# Patient Record
Sex: Female | Born: 1935 | Race: Black or African American | Hispanic: No | Marital: Married | Smoking: Former smoker
Health system: Southern US, Community
[De-identification: ages and names within clinical notes are randomized; demographics above are authoritative.]

## PROBLEM LIST (undated history)

## (undated) DIAGNOSIS — F0390 Unspecified dementia without behavioral disturbance: Secondary | ICD-10-CM

## (undated) DIAGNOSIS — I639 Cerebral infarction, unspecified: Secondary | ICD-10-CM

## (undated) DIAGNOSIS — E7800 Pure hypercholesterolemia, unspecified: Secondary | ICD-10-CM

## (undated) DIAGNOSIS — I10 Essential (primary) hypertension: Secondary | ICD-10-CM

## (undated) DIAGNOSIS — I739 Peripheral vascular disease, unspecified: Secondary | ICD-10-CM

## (undated) HISTORY — PX: ABDOMINAL HYSTERECTOMY: SHX81

## (undated) HISTORY — DX: Essential (primary) hypertension: I10

---

## 2004-08-24 ENCOUNTER — Ambulatory Visit: Payer: Self-pay | Admitting: Internal Medicine

## 2006-01-12 ENCOUNTER — Ambulatory Visit: Payer: Self-pay | Admitting: Internal Medicine

## 2006-09-15 ENCOUNTER — Ambulatory Visit: Payer: Self-pay | Admitting: Ophthalmology

## 2006-09-25 ENCOUNTER — Ambulatory Visit: Payer: Self-pay | Admitting: Ophthalmology

## 2007-01-17 ENCOUNTER — Ambulatory Visit: Payer: Self-pay | Admitting: Internal Medicine

## 2008-01-21 ENCOUNTER — Ambulatory Visit: Payer: Self-pay | Admitting: Internal Medicine

## 2008-12-25 ENCOUNTER — Ambulatory Visit: Payer: Self-pay | Admitting: Gastroenterology

## 2009-03-12 ENCOUNTER — Ambulatory Visit: Payer: Self-pay | Admitting: Internal Medicine

## 2009-09-30 ENCOUNTER — Ambulatory Visit: Payer: Self-pay | Admitting: Internal Medicine

## 2010-05-10 ENCOUNTER — Ambulatory Visit: Payer: Self-pay | Admitting: Internal Medicine

## 2010-12-21 ENCOUNTER — Ambulatory Visit: Payer: Self-pay | Admitting: Family Medicine

## 2011-05-12 ENCOUNTER — Ambulatory Visit: Payer: Self-pay | Admitting: Family Medicine

## 2012-02-08 ENCOUNTER — Ambulatory Visit: Payer: Self-pay | Admitting: Family Medicine

## 2013-01-26 ENCOUNTER — Ambulatory Visit: Payer: Self-pay | Admitting: Emergency Medicine

## 2013-01-26 LAB — CBC WITH DIFFERENTIAL/PLATELET
Basophil #: 0.1 10*3/uL (ref 0.0–0.1)
Basophil %: 0.9 %
Eosinophil #: 0 10*3/uL (ref 0.0–0.7)
Eosinophil %: 0.4 %
HCT: 40.2 % (ref 35.0–47.0)
HGB: 13.2 g/dL (ref 12.0–16.0)
Lymphocyte #: 1.1 10*3/uL (ref 1.0–3.6)
Lymphocyte %: 19.4 %
MCH: 29.8 pg (ref 26.0–34.0)
MCHC: 32.7 g/dL (ref 32.0–36.0)
MCV: 91 fL (ref 80–100)
Monocyte #: 0.3 x10 3/mm (ref 0.2–0.9)
Monocyte %: 5 %
Neutrophil #: 4.3 10*3/uL (ref 1.4–6.5)
Neutrophil %: 74.3 %
Platelet: 234 10*3/uL (ref 150–440)
RBC: 4.42 10*6/uL (ref 3.80–5.20)
RDW: 13.4 % (ref 11.5–14.5)
WBC: 5.7 10*3/uL (ref 3.6–11.0)

## 2013-01-26 LAB — COMPREHENSIVE METABOLIC PANEL
Albumin: 4.6 g/dL (ref 3.4–5.0)
Alkaline Phosphatase: 77 U/L (ref 50–136)
Anion Gap: 8 (ref 7–16)
BUN: 11 mg/dL (ref 7–18)
Bilirubin,Total: 0.5 mg/dL (ref 0.2–1.0)
Calcium, Total: 11.2 mg/dL — ABNORMAL HIGH (ref 8.5–10.1)
Chloride: 100 mmol/L (ref 98–107)
Co2: 33 mmol/L — ABNORMAL HIGH (ref 21–32)
Creatinine: 0.82 mg/dL (ref 0.60–1.30)
EGFR (African American): >60
EGFR (Non-African Amer.): >60
Glucose: 141 mg/dL — ABNORMAL HIGH (ref 65–99)
Osmolality: 283 (ref 275–301)
Potassium: 4.1 mmol/L (ref 3.5–5.1)
SGOT(AST): 24 U/L (ref 15–37)
SGPT (ALT): 22 U/L (ref 12–78)
Sodium: 141 mmol/L (ref 136–145)
Total Protein: 8.7 g/dL — ABNORMAL HIGH (ref 6.4–8.2)

## 2013-03-06 ENCOUNTER — Ambulatory Visit: Payer: Self-pay | Admitting: Family Medicine

## 2013-03-29 ENCOUNTER — Ambulatory Visit: Payer: Self-pay | Admitting: Gastroenterology

## 2014-03-04 ENCOUNTER — Ambulatory Visit: Payer: Self-pay | Admitting: Family Medicine

## 2014-03-13 ENCOUNTER — Ambulatory Visit: Payer: Self-pay | Admitting: Family Medicine

## 2014-04-19 ENCOUNTER — Ambulatory Visit: Payer: Self-pay

## 2014-11-27 ENCOUNTER — Encounter
Admit: 2014-11-27 | Disposition: A | Discharge status: Home / Self Care | Payer: Self-pay | Attending: Gastroenterology | Admitting: Gastroenterology

## 2014-12-03 ENCOUNTER — Ambulatory Visit: Payer: Medicare Other | Admitting: Physical Therapy

## 2014-12-11 ENCOUNTER — Ambulatory Visit: Payer: Medicare Other | Attending: Gastroenterology | Admitting: Physical Therapy

## 2014-12-11 ENCOUNTER — Encounter: Payer: Self-pay | Admitting: Physical Therapy

## 2014-12-11 VITALS — BP 128/58

## 2014-12-11 DIAGNOSIS — K5900 Constipation, unspecified: Secondary | ICD-10-CM | POA: Insufficient documentation

## 2014-12-11 DIAGNOSIS — R278 Other lack of coordination: Secondary | ICD-10-CM | POA: Diagnosis not present

## 2014-12-11 DIAGNOSIS — N3946 Mixed incontinence: Secondary | ICD-10-CM | POA: Diagnosis not present

## 2014-12-11 DIAGNOSIS — M6281 Muscle weakness (generalized): Secondary | ICD-10-CM | POA: Diagnosis not present

## 2014-12-11 DIAGNOSIS — R293 Abnormal posture: Secondary | ICD-10-CM

## 2014-12-11 NOTE — Patient Instructions (Addendum)
   #  1 BELLY MASSAGE  Use natural oil like coconut, olive oil, shea butter. Massage from R pelvis to R rib and then to L ribs and back down on L pelvis. Circle 10x. You can do this after every meal and before pelvic floor exercises.    #2 PELVIC FLOOR / KEGEL EXERCISES   Pelvic floor/ Kegel exercises are used to strengthen the muscles in the base of your pelvis that are responsible for supporting your pelvic organs and preventing urine/feces leakage. Based on your therapist's recommendations, they can be performed while standing, sitting, or lying down.  Make yourself aware of this muscle group by using these cues: . Imagine you are in a crowded room and you feel the need to pass gas. Your response is to pull up and in at the rectum. . Close the rectum. Pull the muscles up inside your body,feeling your scrotum lifting as well . Feel the pelvic floor muscles lift as if you were walking into a cold lake.  Marland Kitchen. Place your hand on top of your pubic bone. Tighten and draw in the muscles around the anal muscles without squeezing the buttock muscles.   Common Errors: . Breath holding: If you are holding your breath, you may be bearing down against your bladder instead of pulling it up. If you belly bulges up while you are squeezing, you are holding your breath. Be sure to breathe gently in and out while exercising. Counting out loud may help you avoid holding your breath. . Accessory muscle use: You should not see or feel other muscle movement when performing pelvic floor exercises. When done properly, no one can tell that you are performing the exercises. Keep the buttocks, belly and inner thighs relaxed. . Overdoing it: Your muscles can fatigue and stop working for you if you over-exercise. You may actually leak more or feel soreness at the lower abdomen or rectum.  YOUR HOME EXERCISE PROGRAM  LONG HOLDS: Position: on back with pillows propped under hips . Inhale and then exhale. Then squeeze the  muscle and count aloud for 10 seconds. Rest with three long breaths. (Be sure to let belly sink in with exhales and not push outward)  . Perform 10 repetitions, 3 times/day                                                                                                   #3 Complete food diary and drink 3 (16 fl oz ) water per day.                #4 Practice sitting tall, feet under knees and sitz bones. No more sitting in a slumped position.

## 2014-12-12 NOTE — Therapy (Signed)
[facility] [address] [place], [place], [address] Phone: [phone]   Fax:  [phone]  Physical Therapy Treatment  Patient Details  Name: Percell Millervelyn Albright Stennett MRN: [id number] Date of Birth: 09/13/1927 Referring Provider:  Elnita Maxwellein, Matthew Gordon, MD  Encounter Date: 12/11/2014      PT End of Session - 12/12/14 2051    Visit Number 2   Number of Visits 12   Date for PT Re-Evaluation 02/05/15   Authorization Type g-code 2/10   PT Start Time 1300   PT Stop Time 1355   PT Time Calculation (min) 55 min   Activity Tolerance Patient tolerated treatment well   Behavior During Therapy [facility] for tasks assessed/performed      Past Medical History  Diagnosis Date  . Hypertension     Past Surgical History  Procedure Laterality Date  . Abdominal hysterectomy      Filed Vitals:   12/11/14 1324  BP: 128/58    Visit Diagnosis:  Abnormal coordination  Abnormal posture  Muscle weakness (generalized)      Subjective Assessment - 12/11/14 1325    Subjective Pt reported she had 2 bowel movements this past week with Type 1. Pt currently drinking 2 -16 fl oz of water per day. Pt used to drink 3 -16 fl oz but not currently.    Pertinent History 1. Bowel leakage (size of silver dollar). Urinary leakage occur when she does not get to the restroom in time. Pt is afraid to go anywhere. 20% of the time, pt notices she will urinate and defecate at the same time. 2) Constipation: dependent on laxatives. Without laxative: pt produces Type 1 Bristol Stool. Dtr was present during interview but not during the assessment. Dtr drove pt to appt but pt still drives. Pt lives with husband. Dtr is close by. Hx of hysterectomy, 7 vaginal deliveries.    Patient Stated Goals 1) to have a complete bowel movement without a laxative 2) go to church and shopping without worrying .                       Pelvic Floor Special Questions -  12/12/14 0001    Prolapse Anterior Wall   Pelvic Floor Internal Exam yes   Exam Type Vaginal   Strength good squeeze, good lift, able to hold agaisnt strong resistance   Strength # of reps 5   Strength # of seconds 10           [facility] Adult PT Treatment/Exercise - 12/12/14 0001    Exercises   Other Exercises  guided pt to perform colon massage    Manual Therapy   Manual therapy comments arvigo abdominal massag, noted improved positioning of pelvic organ postTx and more circumferential contraction compared to posterior > anterior                 PT Education - 12/12/14 2048    Education Details HEP   Person(s) Educated Patient   Methods Explanation;Demonstration;Tactile cues;Handout   Comprehension Verbalized understanding;Returned demonstration;Verbal cues required;Tactile cues required             PT Long Term Goals - 12/12/14 2035    PT LONG TERM GOAL #1   Title Pt will demo increased SLS bilaterally for > 15 sec in order to decrease risk for falls.   Time 12   Period Weeks   Status On-going   PT LONG TERM GOAL #2  Title Pt would decrease the use of pads to 1/day in order to increase participate in community activities.    Time 12   Period Weeks   Status On-going   PT LONG TERM GOAL #3   Title Patient will be independent with completion of home exercise program for pelvic floor exercise, progressing to standing for all ADL's/activities.   Time 12   Period Weeks   Status On-going   PT LONG TERM GOAL #4   Title Patient will score a decrease on COREFO from 34% to < 25% or PFDI -UDI-6 from 37.5% to < 30%  in order to demo improved colorectal or urinary function for attending church.   Time 12   Period Weeks   Status On-going               Plan - 12/12/14 2053    Clinical Impression Statement Pt is progressing well with proper coordination of pelvic floor coordination and showed more circumferential contraction post-manual Tx over abdominal due to  changes in bladder position. Pt is progessing well towards her goals and will benefit from continued skilled PT.    Pt will benefit from skilled therapeutic intervention in order to improve on the following deficits Decreased safety awareness;Decreased endurance;Decreased coordination;Improper body mechanics;Postural dysfunction;Increased fascial restricitons;Increased muscle spasms;Decreased scar mobility;Decreased balance;Decreased strength;Impaired perceived functional ability   Rehab Potential Good   Clinical Impairments Affecting Rehab Potential Hx of 7 vaginal deliveries, abdominal surgeries   PT Frequency 1x / week   PT Duration 12 weeks   PT Treatment/Interventions ADLs/Self Care Home Management;Moist Heat;Therapeutic activities;Patient/family education;Scar mobilization;Therapeutic exercise;Balance training;Manual techniques;Dry needling;Biofeedback;Cryotherapy;Electrical Stimulation;Neuromuscular re-education;Functional mobility training   PT Next Visit Plan progress pelvic floor HEP, added hip strengthening   Consulted and Agree with Plan of Care Patient          G-Codes - 12/12/14 2101    Functional Limitation Self care   Self Care Current Status [phone]([id number]) At least 20 percent but less than 40 percent impaired, limited or restricted   Self Care Goal Status ([id number]) At least 1 percent but less than 20 percent impaired, limited or restricted      Problem List There are no active problems to display for this patient.   Mariane MastersYeung,Shin Yiing ,PT, DPT, E-RYT  12/12/2014, 9:11 PM  [facility] [address] [place], [place], [address] Phone: [phone]   Fax:  [phone]

## 2014-12-18 ENCOUNTER — Ambulatory Visit: Payer: Medicare Other | Admitting: Physical Therapy

## 2014-12-18 ENCOUNTER — Encounter: Payer: Self-pay | Admitting: Physical Therapy

## 2014-12-18 DIAGNOSIS — K5900 Constipation, unspecified: Secondary | ICD-10-CM | POA: Diagnosis not present

## 2014-12-18 DIAGNOSIS — M6281 Muscle weakness (generalized): Secondary | ICD-10-CM

## 2014-12-18 DIAGNOSIS — R278 Other lack of coordination: Secondary | ICD-10-CM

## 2014-12-18 DIAGNOSIS — R293 Abnormal posture: Secondary | ICD-10-CM

## 2014-12-18 NOTE — Patient Instructions (Addendum)
  1) m Shell 45 Degrees  Lying with hips and knees bent 45, one pillow between knees and ankles. Lift knee. Be sure pelvis does not roll backward. Do not arch back. Do 10 times, each leg, 2 times per day.  http://ss.exer.us/75   Copyright  VHI. All rights reserved.   2) You  now ready to begin training the deep core muscles system: diaphragm, transverse abdominis, pelvic floor . These muscles must work together as a team.      The key to these exercises to train the brain to coordinate the timing of these muscles and to have them turn on for long periods of time to hold you upright against gravity (especially important if you are on your feet all day).These muscles are postural muscles and play a role stabilizing your spine and bodyweight. By doing these repetitions slowly and correctly instead of doing crunches, you will achieve a flatter belly without a lower pooch. You are also placing your spine in a more neutral position and breathing properly which in turn, decreases your risk for problems related to your pelvic floor, abdominal, and low back such as pelvic organ prolapse, hernias, diastasis recti (separation of superficial muscles), disk herniations, spinal fractures. These exercises set a solid foundation for you to later progress to resistance/ strength training with therabands and weights and return to other typical fitness exercises with a stronger deeper core.    This week: Practice only Level 1-2. Hold off on 3-4 until our next visit.    3) Long holds to 12 sec , 5 reps --3x day with pillow under hips  4) continue with belly massage and quick holds when seated

## 2014-12-19 NOTE — Therapy (Signed)
[facility] [address] [place], [place], [address] Phone: [phone]   Fax:  [phone]  Physical Therapy Treatment  Patient Details  Name: Erin Greer MRN: [id number] Date of Birth: 08/23/1927 Referring Provider:  Elnita Maxwellein, Matthew Gordon, MD  Encounter Date: 12/18/2014      PT End of Session - 12/18/14 1407    Visit Number 3   Number of Visits 12   Date for PT Re-Evaluation 02/05/15   Authorization Type g-code 3/10   PT Start Time 1300   PT Stop Time 1404   PT Time Calculation (min) 64 min   Activity Tolerance Patient tolerated treatment well   Behavior During Therapy [facility] for tasks assessed/performed      Past Medical History  Diagnosis Date  . Hypertension     Past Surgical History  Procedure Laterality Date  . Abdominal hysterectomy      There were no vitals filed for this visit.  Visit Diagnosis:  Abnormal coordination  Abnormal posture  Muscle weakness (generalized)      Subjective Assessment - 12/18/14 1304    Subjective Pt reported pt had diarrhea four days after last visit day and currently she is had pellet sized bowels without taking any laxative.  Pt has been she trying sit taller. Pt stated she is still having constipation but she no longer experiences loss of control of urine and bowels occuring at the same time.    Pertinent History 1. Bowel leakage (size of silver dollar). Urinary leakage occur when she does not get to the restroom in time. Pt is afraid to go anywhere. 20% of the time, pt notices she will urinate and defecate at the same time. 2) Constipation: dependent on laxatives. Without laxative: pt produces Type 1 Bristol Stool. Dtr was present during interview but not during the assessment. Dtr drove pt to appt but pt still drives. Pt lives with husband. Dtr is close by. Hx of hysterectomy, 7 vaginal deliveries.    Patient Stated Goals 1) to have a complete bowel movement  without a laxative 2) go to church and shopping without worrying .                       Pelvic Floor Special Questions - 12/18/14 1342    Prolapse Anterior Wall  Noted more ventral position without pillow today   Pelvic Floor Internal Exam yes   Exam Type Vaginal   Strength good squeeze, good lift, able to hold agaisnt strong resistance   Strength # of reps 5   Strength # of seconds 12           [facility] Adult PT Treatment/Exercise - 12/18/14 1341    Bed Mobility   Bed Mobility Rolling Left;Left Sidelying to Sit  Required cuing to avoid crunch and practiced 2 reps   Posture/Postural Control   Posture/Postural Control Postural limitations   Postural Limitations --  Lumbopelvic instability w/knee fallout prior to neuro-reed   Posture Comments Dynamic stabilization Level 1-2   minor cuing    Exercises   Other Exercises  --   Knee/Hip Exercises: Sidelying   Clams 10 reps bil    Manual Therapy   Manual therapy comments arvigo abdominal massage  Noted increased abdominal softness and scar mobili                PT Education - 12/19/14 1427    Education provided Yes   Education Details HEP  Person(s) Educated Patient   Methods Explanation;Demonstration;Tactile cues;Handout   Comprehension Verbalized understanding;Returned demonstration;Verbal cues required;Tactile cues required             PT Long Term Goals - 12/18/14 1312    PT LONG TERM GOAL #1   Title Pt will demo increased SLS bilaterally for > 15 sec in order to decrease risk for falls.   Time 12   Period Weeks   Status On-going   PT LONG TERM GOAL #2   Title Pt would decrease the use of pads to 1/day in order to increase participate in community activities.    Time 12   Period Weeks   Status Achieved   PT LONG TERM GOAL #3   Title Patient will be independent with completion of home exercise program for pelvic floor exercise, progressing to standing for all ADL's/activities.   Time 12    Period Weeks   Status Achieved   PT LONG TERM GOAL #4   Title Patient will score a decrease on COREFO from 34% to < 25% or PFDI -UDI-6 from 37.5% to < 30%  in order to demo improved colorectal or urinary function for attending church.   Time 12   Period Weeks   Status On-going   PT LONG TERM GOAL #5   Title Pt will be able to report decreased simulatenous elimination of bowel and urine in order to show increased control of pelvic floor tio attend church.    Time 12   Period Weeks   Status Achieved               Plan - 12/19/14 1428    Clinical Impression Statement Pt showed increased cephalic/ventral positoining of bladder without a need for elevation of her hips to demo a circumferential contraction. Pt was able to progress her pelvic floor endurance exercises, and start dynamic stabilization exercises. Also noted improved scar mobility. To address diet for constipation goal when pt returns her food diary at next visit. Pt is compliant and continues to show progress towards her goals.    Pt will benefit from skilled therapeutic intervention in order to improve on the following deficits Decreased safety awareness;Decreased endurance;Decreased coordination;Improper body mechanics;Postural dysfunction;Increased fascial restricitons;Increased muscle spasms;Decreased scar mobility;Decreased balance;Decreased strength;Impaired perceived functional ability   Rehab Potential Good   Clinical Impairments Affecting Rehab Potential Hx of 7 vaginal deliveries, abdominal surgeries   PT Frequency 1x / week   PT Duration 12 weeks   PT Treatment/Interventions ADLs/Self Care Home Management;Moist Heat;Therapeutic activities;Patient/family education;Scar mobilization;Therapeutic exercise;Balance training;Manual techniques;Dry needling;Biofeedback;Cryotherapy;Electrical Stimulation;Neuromuscular re-education;Functional mobility training   PT Next Visit Plan progress pelvic floor HEP, added hip  strengthening   Consulted and Agree with Plan of Care Patient        Problem List There are no active problems to display for this patient.   Mariane MastersYeung,Shin Yiing ,PT, DPT, E-RYT  12/19/2014, 2:33 PM  [facility] [address] [place], [place], [address] Phone: [phone]   Fax:  [phone]

## 2014-12-25 ENCOUNTER — Ambulatory Visit: Payer: Medicare Other | Admitting: Physical Therapy

## 2015-01-01 ENCOUNTER — Ambulatory Visit: Payer: Medicare Other | Attending: Gastroenterology | Admitting: Physical Therapy

## 2015-01-01 DIAGNOSIS — R278 Other lack of coordination: Secondary | ICD-10-CM | POA: Diagnosis not present

## 2015-01-01 DIAGNOSIS — M6281 Muscle weakness (generalized): Secondary | ICD-10-CM

## 2015-01-01 DIAGNOSIS — R293 Abnormal posture: Secondary | ICD-10-CM | POA: Diagnosis present

## 2015-01-01 NOTE — Patient Instructions (Addendum)
High-Fiber Diet Fiber is found in fruits, vegetables, and grains. A high-fiber diet encourages the addition of more whole grains, legumes, fruits, and vegetables in your diet. The recommended amount of fiber for adult males is 38 g per day. For adult females, it is 25 g per day. Pregnant and lactating women should get 28 g of fiber per day. If you have a digestive or bowel problem, ask your caregiver for advice before adding high-fiber foods to your diet. Eat a variety of high-fiber foods instead of only a select few type of foods.  PURPOSE  To increase stool bulk.  To make bowel movements more regular to prevent constipation.  To lower cholesterol.  To prevent overeating. WHEN IS THIS DIET USED?  It may be used if you have constipation and hemorrhoids.  It may be used if you have uncomplicated diverticulosis (intestine condition) and irritable bowel syndrome.  It may be used if you need help with weight management.  It may be used if you want to add it to your diet as a protective measure against atherosclerosis, diabetes, and cancer. SOURCES OF FIBER  Whole-grain breads and cereals.  Fruits, such as apples, oranges, bananas, berries, prunes, and pears.  Vegetables, such as green peas, carrots, sweet potatoes, beets, broccoli, cabbage, spinach, and artichokes.  Legumes, such split peas, soy, lentils.  Almonds. FIBER CONTENT IN FOODS Starches and Grains / Dietary Fiber (g)  Cheerios, 1 cup / 3 g  Corn Flakes cereal, 1 cup / 0.7 g  Rice crispy treat cereal, 1 cup / 0.3 g  Instant oatmeal (cooked),  cup / 2 g  Frosted wheat cereal, 1 cup / 5.1 g  Brown, long-grain rice (cooked), 1 cup / 3.5 g  White, long-grain rice (cooked), 1 cup / 0.6 g  Enriched macaroni (cooked), 1 cup / 2.5 g Legumes / Dietary Fiber (g)  Baked beans (canned, plain, or vegetarian),  cup / 5.2 g  Kidney beans (canned),  cup / 6.8 g  Pinto beans (cooked),  cup / 5.5 g Breads and Crackers  / Dietary Fiber (g)  Plain or honey graham crackers, 2 squares / 0.7 g  Saltine crackers, 3 squares / 0.3 g  Plain, salted pretzels, 10 pieces / 1.8 g  Whole-wheat bread, 1 slice / 1.9 g  White bread, 1 slice / 0.7 g  Raisin bread, 1 slice / 1.2 g  Plain bagel, 3 oz / 2 g  Flour tortilla, 1 oz / 0.9 g  Corn tortilla, 1 small / 1.5 g  Hamburger or hotdog bun, 1 small / 0.9 g Fruits / Dietary Fiber (g)  Apple with skin, 1 medium / 4.4 g  Sweetened applesauce,  cup / 1.5 g  Banana,  medium / 1.5 g  Grapes, 10 grapes / 0.4 g  Orange, 1 small / 2.3 g  Raisin, 1.5 oz / 1.6 g  Melon, 1 cup / 1.4 g Vegetables / Dietary Fiber (g)  Green beans (canned),  cup / 1.3 g  Carrots (cooked),  cup / 2.3 g  Broccoli (cooked),  cup / 2.8 g  Peas (cooked),  cup / 4.4 g  Mashed potatoes,  cup / 1.6 g  Lettuce, 1 cup / 0.5 g  Corn (canned),  cup / 1.6 g  Tomato,  cup / 1.1 g Document Released: 07/18/2005 Document Revised: 01/17/2012 Document Reviewed: 10/20/2011 ExitCare Patient Information 2015 [place], [place]. This information is not intended to replace advice given to you by your health care provider.  Make sure you discuss any questions you have with your health care provider.    1. HEP:  Monster walk, side step w/ yellow band 4 laps in hallway seated hip abd w/ yellow band 10x 2 sets Side step + minisquat 4 laps in hallway  Piriformis stretches (figure 4 and cross over) 5 breaths after exercises Continue with PFM 12 sec holds, 7 reps 3 x day  2. Incorporate oatmeal, fax meal, prunes into breakfast, increase fiber (veggies at lunch/dinner and fruit at breakfast/snack), probiotics for increased digestion

## 2015-01-01 NOTE — Therapy (Signed)
[facility] [address] [place], [place], [address] Phone: [phone]   Fax:  [phone]  Physical Therapy Treatment  Patient Details  Name: Percell Millervelyn Albright Kleinman MRN: [id number] Date of Birth: 08/28/1927 Referring Provider:  Elnita Maxwellein, Matthew Gordon, MD  Encounter Date: 01/01/2015      PT End of Session - 01/01/15 1408    Visit Number 4   Number of Visits 12   Date for PT Re-Evaluation 02/05/15   Authorization Type g-code 4/10   PT Start Time 1307   PT Stop Time 1350   PT Time Calculation (min) 43 min   Activity Tolerance Patient tolerated treatment well;No increased pain   Behavior [facility] tasks assessed/performed      Past Medical History  Diagnosis Date  . Hypertension     Past Surgical History  Procedure Laterality Date  . Abdominal hysterectomy      There were no vitals filed for this visit.  Visit Diagnosis:  No diagnosis found.      Subjective Assessment - 01/01/15 1308    Subjective Pt reported pt has been performing her pelvic floor HEP. Pt has been able to control her bladder and not leak by the time she reaches a toilet. Pt reported she continues to be constipated and has pellet sized bowel momvents.     Pertinent History 1. Bowel leakage (size of silver dollar). Urinary leakage occur when she does not get to the restroom in time. Pt is afraid to go anywhere. 20% of the time, pt notices she will urinate and defecate at the same time. 2) Constipation: dependent on laxatives. Without laxative: pt produces Type 1 Bristol Stool. Dtr was present during interview but not during the assessment. Dtr drove pt to appt but pt still drives. Pt lives with husband. Dtr is close by. Hx of hysterectomy, 7 vaginal deliveries.    Patient Stated Goals 1) to have a complete bowel movement without a laxative 2) go to church and shopping without worrying .                          [facility]  Adult PT Treatment/Exercise - 01/01/15 0001    Exercises   Other Exercises  stretngthening: 20 ft, yellow band: monster walk, sidestep  20 ft side step + squat (cues for alignment, exhale)   Knee/Hip Exercises: Stretches   Piriformis Stretch --  5 breaths, figure -4 and thighs crossed   Knee/Hip Exercises: Sidelying   Hip ABduction Other (comment)  seated yellow band 10reps x 2                 PT Education - 01/01/15 1407    Education provided Yes   Education Details HEP and change frequency to every other week for more practice on HEP   Person(s) Educated Patient   Methods Explanation;Demonstration;Tactile cues;Verbal cues;Handout   Comprehension Verbalized understanding             PT Long Term Goals - 12/18/14 1312    PT LONG TERM GOAL #1   Title Pt will demo increased SLS bilaterally for > 15 sec in order to decrease risk for falls.   Time 12   Period Weeks   Status On-going   PT LONG TERM GOAL #2   Title Pt would decrease the use of pads to 1/day in order to increase participate in community activities.    Time 12  Period Weeks   Status Achieved   PT LONG TERM GOAL #3   Title Patient will be independent with completion of home exercise program for pelvic floor exercise, progressing to standing for all ADL's/activities.   Time 12   Period Weeks   Status Achieved   PT LONG TERM GOAL #4   Title Patient will score a decrease on COREFO from 34% to < 25% or PFDI -UDI-6 from 37.5% to < 30%  in order to demo improved colorectal or urinary function for attending church.   Time 12   Period Weeks   Status On-going   PT LONG TERM GOAL #5   Title Pt will be able to report decreased simulatenous elimination of bowel and urine in order to show increased control of pelvic floor tio attend church.    Time 12   Period Weeks   Status Achieved               Plan - 01/01/15 1408    Clinical Impression Statement Pt progressed to hip strengthening exercises  today and was educated on increasing fiber. Pt declined referral to dietition and wanted to try utilizing the information provided to her today first. Decreased frequency of visits to every other week to provide opportunity for HEP compliance. Anticipate pt will achieve her goals with continued skilled PT on strengthening.     Pt will benefit from skilled therapeutic intervention in order to improve on the following deficits Decreased safety awareness;Decreased endurance;Decreased coordination;Improper body mechanics;Postural dysfunction;Increased fascial restricitons;Increased muscle spasms;Decreased scar mobility;Decreased balance;Decreased strength;Impaired perceived functional ability   Rehab Potential Good   Clinical Impairments Affecting Rehab Potential Hx of 7 vaginal deliveries, abdominal surgeries   PT Frequency 1x / week   PT Duration 12 weeks   PT Treatment/Interventions ADLs/Self Care Home Management;Moist Heat;Therapeutic activities;Patient/family education;Scar mobilization;Therapeutic exercise;Balance training;Manual techniques;Dry needling;Biofeedback;Cryotherapy;Electrical Stimulation;Neuromuscular re-education;Functional mobility training   PT Next Visit Plan progress pelvic floor HEP, added hip strengthening   Consulted and Agree with Plan of Care Patient        Problem List There are no active problems to display for this patient.   Mariane Masters ,PT, DPT, E-RYT  01/01/2015, 2:11 PM  [place] [facility] MAIN [facility] SERVICES [address] [place], [place], [address] Phone: [phone]   Fax:  [phone]

## 2015-01-08 ENCOUNTER — Ambulatory Visit: Payer: Medicare Other | Admitting: Physical Therapy

## 2015-01-15 ENCOUNTER — Ambulatory Visit: Payer: Medicare Other | Admitting: Physical Therapy

## 2015-01-15 DIAGNOSIS — M6281 Muscle weakness (generalized): Secondary | ICD-10-CM

## 2015-01-15 DIAGNOSIS — R278 Other lack of coordination: Secondary | ICD-10-CM

## 2015-01-15 DIAGNOSIS — R293 Abnormal posture: Secondary | ICD-10-CM

## 2015-01-16 NOTE — Therapy (Signed)
[facility] MAIN [facility] SERVICES [address] [place], [place], [address] Phone: [phone]   Fax:  [phone]    PHYSICAL THERAPY DISCHARGE SUMMARY  Visits from Start of Care: 5  Current functional level related to goals / functional outcomes: See below   Remaining deficits: none   Education / Equipment: See below  Plan: Patient agrees to discharge.  Patient goals were met. Patient is being discharged due to meeting the stated rehab goals.  ?????        Physical Therapy Treatment  Patient Details  Name: Erin Greer MRN: [id number] Date of Birth: 07-20-1928 Referring Provider:  Elnita Maxwell, MD  Encounter Date: 01/15/2015    Past Medical History  Diagnosis Date  . Hypertension     Past Surgical History  Procedure Laterality Date  . Abdominal hysterectomy      There were no vitals filed for this visit.  Visit Diagnosis:  Abnormal coordination  Abnormal posture  Muscle weakness (generalized)          [facility] PT Assessment - 01/16/15 2229    Observation/Other Assessments   Other Surveys  --  COREFO 17%, UDI 33%                     [facility] Adult PT Treatment/Exercise - 01/16/15 2229    Posture/Postural Control   Posture Comments lifting bag 5# with deep core coordination 5 reps  required cues for squeezing anus (pt reported passing gas).    Exercises   Other Exercises  diaphragmatic breathing in seated (pelvic floor lift instead of bearing down) moderate cues requried  red band, chest rows 5reps with deep core                PT Education - 01/16/15 2233    Education provided Yes   Education Details Silver Sneakers Program, continuation HEP to maintain Pelvic Floor Strength, continuation of high fiber diet   Person(s) Educated Patient   Methods Explanation;Demonstration;Tactile cues;Verbal cues;Handout   Comprehension Verbalized understanding             PT  Long Term Goals - 01/15/15 1314    PT LONG TERM GOAL #1   Title Pt will demo increased SLS bilaterally for > 15 sec in order to decrease risk for falls.   Time 12   Period Weeks   Status Unable to assess   PT LONG TERM GOAL #2   Title Pt would decrease the use of pads to 1/day in order to increase participate in community activities.    Time 12   Period Weeks   Status Achieved   PT LONG TERM GOAL #3   Title Patient will be independent with completion of home exercise program for pelvic floor exercise, progressing to standing for all ADL's/activities.   Time 12   Period Weeks   Status Achieved   PT LONG TERM GOAL #4   Title Patient will score a decrease on COREFO from 34% to < 25% or PFDI -UDI-6 from 37.5% to < 30%  in order to demo improved colorectal or urinary function for attending church.  (01/15/15: COREFO 17%,: UDI 33%  )   Time 12   Period Weeks   Status Achieved   PT LONG TERM GOAL #5   Title Pt will be able to report decreased simulatenous elimination of bowel and urine in order to show increased control of pelvic floor tio attend church.    Time 12  Period Weeks   Status Achieved               Plan - 02/09/15 2234    Clinical Impression Statement Pt has achieved her goals and ready for D/C. Pt's scores on COREFO decreased from 34% to 17% and UDI from 37.5% to 33% indicating significantly improved bowel and bladder function. Pt uses only 1 pad per day. Pt understands the importance of continuing HEP to minimize relapse of Sx.Pt was educated on applying use of deep core mm in functional activities such as lifting groceries bags.  Pt was pleasant to work with and was motivated throughout her rehab. Thank you for the referral.    Pt will benefit from skilled therapeutic intervention in order to improve on the following deficits Decreased safety awareness;Decreased endurance;Decreased coordination;Improper body mechanics;Postural dysfunction;Increased fascial  restricitons;Increased muscle spasms;Decreased scar mobility;Decreased balance;Decreased strength;Impaired perceived functional ability   Rehab Potential Good   Clinical Impairments Affecting Rehab Potential Hx of 7 vaginal deliveries, abdominal surgeries   PT Frequency 1x / week   PT Duration 12 weeks   PT Treatment/Interventions ADLs/Self Care Home Management;Moist Heat;Therapeutic activities;Patient/family education;Scar mobilization;Therapeutic exercise;Balance training;Manual techniques;Dry needling;Biofeedback;Cryotherapy;Electrical Stimulation;Neuromuscular re-education;Functional mobility training   PT Next Visit Plan progress pelvic floor HEP, added hip strengthening   Consulted and Agree with Plan of Care Patient          G-Codes - 09-Feb-2015 11/01/27    Functional Assessment Tool Used COREFO   Functional Limitation Self care   Self Care Current Status [phone]) At least 20 percent but less than 40 percent impaired, limited or restricted   Self Care Goal Status (R4270) At least 1 percent but less than 20 percent impaired, limited or restricted   Self Care Discharge Status [phone]) At least 1 percent but less than 20 percent impaired, limited or restricted      Problem List There are no active problems to display for this patient.   Jerl Mina ,PT, DPT, E-RYT February 09, 2015, 10:38 PM  [place] MAIN [facility] SERVICES [address] [place], [place], [address] Phone: [phone]   Fax:  [phone]

## 2015-01-29 ENCOUNTER — Encounter: Payer: Medicare Other | Admitting: Physical Therapy

## 2015-02-26 ENCOUNTER — Other Ambulatory Visit: Payer: Self-pay | Admitting: Family Medicine

## 2015-02-26 DIAGNOSIS — Z1239 Encounter for other screening for malignant neoplasm of breast: Secondary | ICD-10-CM

## 2015-10-12 ENCOUNTER — Other Ambulatory Visit: Payer: Self-pay | Admitting: Vascular Surgery

## 2015-10-13 ENCOUNTER — Encounter
Admission: RE | Disposition: A | Discharge status: Home / Self Care | Payer: Self-pay | Source: Ambulatory Visit | Attending: Vascular Surgery

## 2015-10-13 ENCOUNTER — Inpatient Hospital Stay
Admission: RE | Admit: 2015-10-13 | Discharge: 2015-10-14 | DRG: 272 | Disposition: A | Discharge status: Home / Self Care | Payer: Medicare Other | Source: Ambulatory Visit | Attending: Vascular Surgery | Admitting: Vascular Surgery

## 2015-10-13 DIAGNOSIS — I70222 Atherosclerosis of native arteries of extremities with rest pain, left leg: Principal | ICD-10-CM | POA: Diagnosis present

## 2015-10-13 DIAGNOSIS — I70229 Atherosclerosis of native arteries of extremities with rest pain, unspecified extremity: Secondary | ICD-10-CM | POA: Diagnosis present

## 2015-10-13 DIAGNOSIS — Z90710 Acquired absence of both cervix and uterus: Secondary | ICD-10-CM

## 2015-10-13 DIAGNOSIS — Z79899 Other long term (current) drug therapy: Secondary | ICD-10-CM | POA: Diagnosis not present

## 2015-10-13 DIAGNOSIS — I10 Essential (primary) hypertension: Secondary | ICD-10-CM | POA: Diagnosis present

## 2015-10-13 DIAGNOSIS — I739 Peripheral vascular disease, unspecified: Secondary | ICD-10-CM | POA: Diagnosis present

## 2015-10-13 HISTORY — PX: PERIPHERAL VASCULAR CATHETERIZATION: SHX172C

## 2015-10-13 LAB — CREATININE, SERUM
CREATININE: 0.65 mg/dL (ref 0.44–1.00)
GFR calc Af Amer: >60 mL/min (ref >60)

## 2015-10-13 LAB — MRSA PCR SCREENING: MRSA by PCR: NEGATIVE

## 2015-10-13 LAB — BUN: BUN: 14 mg/dL (ref 6–20)

## 2015-10-13 SURGERY — ABDOMINAL AORTOGRAM W/LOWER EXTREMITY
Wound class: Clean

## 2015-10-13 MED ORDER — ACETAMINOPHEN 325 MG RE SUPP: 325.0000 mg | RECTAL | Status: DC | PRN

## 2015-10-13 MED ORDER — PANTOPRAZOLE SODIUM 40 MG PO TBEC
40.0000 mg | DELAYED_RELEASE_TABLET | Freq: Every day | ORAL | Status: DC
  Administered 2015-10-14: 40 mg via ORAL
  Filled 2015-10-13: qty 1

## 2015-10-13 MED ORDER — DEXTROSE 5 % IV SOLN
INTRAVENOUS | Status: AC
  Filled 2015-10-13 (×44): qty 1.5

## 2015-10-13 MED ORDER — FENTANYL CITRATE (PF) 100 MCG/2ML IJ SOLN
INTRAMUSCULAR | Status: AC
  Filled 2015-10-13: qty 2

## 2015-10-13 MED ORDER — SORBITOL 70 % SOLN
30.0000 mL | Freq: Every day | Status: DC | PRN
  Filled 2015-10-13: qty 30

## 2015-10-13 MED ORDER — HEPARIN (PORCINE) IN NACL 2-0.9 UNIT/ML-% IJ SOLN
INTRAMUSCULAR | Status: AC
  Filled 2015-10-13: qty 1000

## 2015-10-13 MED ORDER — HYDROMORPHONE HCL 1 MG/ML IJ SOLN: 1.0000 mg | Freq: Once | INTRAMUSCULAR | Status: DC

## 2015-10-13 MED ORDER — HYDROCHLOROTHIAZIDE 12.5 MG PO CAPS
12.5000 mg | ORAL_CAPSULE | Freq: Every day | ORAL | Status: DC
  Administered 2015-10-14: 12.5 mg via ORAL
  Filled 2015-10-13: qty 1

## 2015-10-13 MED ORDER — SODIUM CHLORIDE 0.9 % IV SOLN
INTRAVENOUS | Status: DC
  Administered 2015-10-13: 08:00:00 via INTRAVENOUS

## 2015-10-13 MED ORDER — MORPHINE SULFATE (PF) 4 MG/ML IV SOLN: 2.0000 mg | INTRAVENOUS | Status: DC | PRN

## 2015-10-13 MED ORDER — LIDOCAINE HCL 1 % IJ SOLN
INTRAMUSCULAR | Status: DC | PRN
  Administered 2015-10-13: 5 mL via INTRADERMAL

## 2015-10-13 MED ORDER — DOCUSATE SODIUM 100 MG PO CAPS
100.0000 mg | ORAL_CAPSULE | Freq: Every day | ORAL | Status: DC
  Administered 2015-10-14: 100 mg via ORAL
  Filled 2015-10-13: qty 1

## 2015-10-13 MED ORDER — POTASSIUM CHLORIDE CRYS ER 20 MEQ PO TBCR: 20.0000 meq | EXTENDED_RELEASE_TABLET | Freq: Every day | ORAL | Status: DC | PRN

## 2015-10-13 MED ORDER — METOPROLOL TARTRATE 1 MG/ML IV SOLN
2.0000 mg | INTRAVENOUS | Status: DC | PRN
Start: 2015-10-13 — End: 2015-10-14

## 2015-10-13 MED ORDER — HYDRALAZINE HCL 20 MG/ML IJ SOLN
INTRAMUSCULAR | Status: DC | PRN
  Administered 2015-10-13: 10 mg via INTRAVENOUS

## 2015-10-13 MED ORDER — IOHEXOL 300 MG/ML  SOLN
INTRAMUSCULAR | Status: DC | PRN
Start: 2015-10-13 — End: 2015-10-13
  Administered 2015-10-13: 105 mL via INTRA_ARTERIAL

## 2015-10-13 MED ORDER — TIROFIBAN (AGGRASTAT) BOLUS VIA INFUSION
25.0000 ug/kg | Freq: Once | INTRAVENOUS | Status: AC
  Administered 2015-10-13: 1167.5 ug via INTRAVENOUS
  Filled 2015-10-13: qty 24

## 2015-10-13 MED ORDER — ALUM & MAG HYDROXIDE-SIMETH 200-200-20 MG/5ML PO SUSP: 15.0000 mL | ORAL | Status: DC | PRN

## 2015-10-13 MED ORDER — HEPARIN SODIUM (PORCINE) 1000 UNIT/ML IJ SOLN
INTRAMUSCULAR | Status: DC | PRN
  Administered 2015-10-13: 5000 [IU] via INTRAVENOUS

## 2015-10-13 MED ORDER — ADULT MULTIVITAMIN W/MINERALS CH
1.0000 | ORAL_TABLET | Freq: Every day | ORAL | Status: DC
Start: 2015-10-13 — End: 2015-10-14
  Administered 2015-10-14: 1 via ORAL
  Filled 2015-10-13: qty 1

## 2015-10-13 MED ORDER — IRBESARTAN-HYDROCHLOROTHIAZIDE 150-12.5 MG PO TABS: 1.0000 | ORAL_TABLET | Freq: Every day | ORAL | Status: DC

## 2015-10-13 MED ORDER — PHENOL 1.4 % MT LIQD
1.0000 | OROMUCOSAL | Status: DC | PRN
  Filled 2015-10-13: qty 177

## 2015-10-13 MED ORDER — LIDOCAINE HCL (PF) 1 % IJ SOLN
INTRAMUSCULAR | Status: AC
  Filled 2015-10-13: qty 30

## 2015-10-13 MED ORDER — OXYCODONE HCL 5 MG PO TABS: 5.0000 mg | ORAL_TABLET | ORAL | Status: DC | PRN

## 2015-10-13 MED ORDER — HYDRALAZINE HCL 20 MG/ML IJ SOLN
INTRAMUSCULAR | Status: AC
  Filled 2015-10-13: qty 1

## 2015-10-13 MED ORDER — HYDRALAZINE HCL 20 MG/ML IJ SOLN: 5.0000 mg | INTRAMUSCULAR | Status: DC | PRN

## 2015-10-13 MED ORDER — MIDAZOLAM HCL 5 MG/5ML IJ SOLN
INTRAMUSCULAR | Status: AC
  Filled 2015-10-13: qty 5

## 2015-10-13 MED ORDER — ONDANSETRON HCL 4 MG/2ML IJ SOLN: 4.0000 mg | Freq: Four times a day (QID) | INTRAMUSCULAR | Status: DC | PRN

## 2015-10-13 MED ORDER — HEPARIN SODIUM (PORCINE) 1000 UNIT/ML IJ SOLN
INTRAMUSCULAR | Status: AC
  Filled 2015-10-13: qty 1

## 2015-10-13 MED ORDER — CLOPIDOGREL BISULFATE 75 MG PO TABS
75.0000 mg | ORAL_TABLET | Freq: Every day | ORAL | Status: DC
  Administered 2015-10-14: 75 mg via ORAL
  Filled 2015-10-13: qty 1

## 2015-10-13 MED ORDER — SODIUM CHLORIDE 0.9 % IV SOLN
INTRAVENOUS | Status: AC
  Administered 2015-10-13: 50 mL/h via INTRAVENOUS

## 2015-10-13 MED ORDER — BRIMONIDINE TARTRATE 0.15 % OP SOLN
1.0000 [drp] | Freq: Two times a day (BID) | OPHTHALMIC | Status: DC
  Administered 2015-10-13 – 2015-10-14 (×2): 1 [drp] via OPHTHALMIC
  Filled 2015-10-13: qty 5

## 2015-10-13 MED ORDER — METHYLPREDNISOLONE SODIUM SUCC 125 MG IJ SOLR: 125.0000 mg | INTRAMUSCULAR | Status: DC | PRN

## 2015-10-13 MED ORDER — TIROFIBAN HCL IV 12.5 MG/250 ML
0.0750 ug/kg/min | INTRAVENOUS | Status: AC
  Filled 2015-10-13: qty 250

## 2015-10-13 MED ORDER — POLYETHYLENE GLYCOL 3350 17 G PO PACK: 17.0000 g | PACK | Freq: Every day | ORAL | Status: DC | PRN

## 2015-10-13 MED ORDER — LABETALOL HCL 5 MG/ML IV SOLN: 10.0000 mg | INTRAVENOUS | Status: DC | PRN

## 2015-10-13 MED ORDER — FENTANYL CITRATE (PF) 100 MCG/2ML IJ SOLN
INTRAMUSCULAR | Status: DC | PRN
  Administered 2015-10-13 (×3): 50 ug via INTRAVENOUS

## 2015-10-13 MED ORDER — DEXTROSE 5 % IV SOLN
1.5000 g | INTRAVENOUS | Status: AC
  Administered 2015-10-13: 1.5 g via INTRAVENOUS

## 2015-10-13 MED ORDER — SODIUM CHLORIDE 0.9 % IV SOLN: 500.0000 mL | Freq: Once | INTRAVENOUS | Status: DC | PRN

## 2015-10-13 MED ORDER — ASPIRIN 81 MG PO CHEW: 81.0000 mg | CHEWABLE_TABLET | Freq: Every day | ORAL | Status: DC

## 2015-10-13 MED ORDER — MAGNESIUM SULFATE 2 GM/50ML IV SOLN
2.0000 g | Freq: Every day | INTRAVENOUS | Status: DC | PRN
  Filled 2015-10-13: qty 50

## 2015-10-13 MED ORDER — IRBESARTAN 75 MG PO TABS
150.0000 mg | ORAL_TABLET | Freq: Every day | ORAL | Status: DC
  Administered 2015-10-14: 150 mg via ORAL
  Filled 2015-10-13: qty 2

## 2015-10-13 MED ORDER — GUAIFENESIN-DM 100-10 MG/5ML PO SYRP: 15.0000 mL | ORAL_SOLUTION | ORAL | Status: DC | PRN

## 2015-10-13 MED ORDER — ACETAMINOPHEN 325 MG PO TABS: 325.0000 mg | ORAL_TABLET | ORAL | Status: DC | PRN

## 2015-10-13 MED ORDER — DORZOLAMIDE HCL 2 % OP SOLN
1.0000 [drp] | Freq: Two times a day (BID) | OPHTHALMIC | Status: DC
  Administered 2015-10-13 – 2015-10-14 (×2): 1 [drp] via OPHTHALMIC
  Filled 2015-10-13: qty 10

## 2015-10-13 MED ORDER — MIDAZOLAM HCL 2 MG/2ML IJ SOLN
INTRAMUSCULAR | Status: DC | PRN
  Administered 2015-10-13: 2 mg via INTRAVENOUS
  Administered 2015-10-13 (×2): 1 mg via INTRAVENOUS

## 2015-10-13 MED ORDER — FAMOTIDINE 20 MG PO TABS: 40.0000 mg | ORAL_TABLET | ORAL | Status: DC | PRN

## 2015-10-13 SURGICAL SUPPLY — 42 items
BALLN ARMADA 2.5X100X150 (BALLOONS) ×5
BALLN LUTONIX 4X150X130 (BALLOONS) ×5
BALLN LUTONIX 5X150X130 (BALLOONS) ×5
BALLN LUTONIX DCB 5X100X130 (BALLOONS) ×5
BALLN LUTONIX DCB 5X40X130 (BALLOONS) ×5
BALLN LUTONIX DCB 7X40X130 (BALLOONS) ×5
BALLN ULTRVRSE 3X300X150 (BALLOONS) ×2
BALLN ULTRVRSE 3X300X150 OTW (BALLOONS) ×3
BALLN ULTRVRSE 4X300X150 (BALLOONS) ×5
BALLOON ARMADA 2.5X100X150 (BALLOONS) ×3 IMPLANT
BALLOON LUTONIX 4X150X130 (BALLOONS) ×3 IMPLANT
BALLOON LUTONIX 5X150X130 (BALLOONS) ×3 IMPLANT
BALLOON LUTONIX DCB 5X100X130 (BALLOONS) ×3 IMPLANT
BALLOON LUTONIX DCB 5X40X130 (BALLOONS) ×3 IMPLANT
BALLOON LUTONIX DCB 7X40X130 (BALLOONS) ×3 IMPLANT
BALLOON ULTRVRSE 3X300X150 OTW (BALLOONS) ×3 IMPLANT
BALLOON ULTRVRSE 4X300X150 (BALLOONS) ×3 IMPLANT
CATH CROSSER 14S OTW 146CM (CATHETERS) ×5 IMPLANT
CATH CXI SUPP ANG 4FR 135 (MICROCATHETER) ×3 IMPLANT
CATH CXI SUPP ANG 4FR 135CM (MICROCATHETER) ×5
CATH PIG 70CM (CATHETERS) ×5 IMPLANT
CATH SIDEKICK XL ST 110CM (SHEATH) ×5 IMPLANT
CATH STS 5FR 125CM (CATHETERS) ×5 IMPLANT
DEVICE PRESTO INFLATION (MISCELLANEOUS) ×5 IMPLANT
DEVICE STARCLOSE SE CLOSURE (Vascular Products) ×5 IMPLANT
GLIDEWIRE ANGLED SS 035X260CM (WIRE) ×5 IMPLANT
KIT FLOWMATE PROCEDURAL (MISCELLANEOUS) ×5 IMPLANT
PACK ANGIOGRAPHY (CUSTOM PROCEDURE TRAY) ×5 IMPLANT
SET INTRO CAPELLA COAXIAL (SET/KITS/TRAYS/PACK) ×5 IMPLANT
SHEATH ANL2 6FRX45 HC (SHEATH) ×5 IMPLANT
SHEATH BRITE TIP 5FRX11 (SHEATH) ×5 IMPLANT
SHEATH BRITE TIP 6FRX11 (SHEATH) ×5 IMPLANT
SHEATH BRITE TIP 6FRX5.5 (SHEATH) IMPLANT
STENT LIFESTAR 8X40 (Permanent Stent) ×5 IMPLANT
SYR MEDRAD MARK V 150ML (SYRINGE) ×5 IMPLANT
TOWEL OR 17X26 4PK STRL BLUE (TOWEL DISPOSABLE) ×5 IMPLANT
TUBING CONTRAST HIGH PRESS 72 (TUBING) ×5 IMPLANT
VALVE HEMO TOUHY BORST Y (VALVE) ×5 IMPLANT
WIRE G V18X300CM (WIRE) ×5 IMPLANT
WIRE J 3MM .035X145CM (WIRE) ×5 IMPLANT
WIRE MAGIC TORQUE 260C (WIRE) ×5 IMPLANT
WIRE SPARTACORE .014X300CM (WIRE) ×10 IMPLANT

## 2015-10-13 NOTE — Progress Notes (Addendum)
Right femoral PAD deflated 30 ml air per policy, (+) capillary refill, reinflated to previous amount. 

## 2015-10-13 NOTE — Progress Notes (Addendum)
Right femoral PAD deflated 30 ml air per policy, (+) capillary refill, reinflated to previous amount.

## 2015-10-13 NOTE — Op Note (Signed)
[facility] Percutaneous Study/Intervention Procedural Note   Date of Surgery: 10/13/2015  Surgeon:  Renford Dills, MD.  Pre-operative Diagnosis: Atherosclerotic occlusive disease bilateral lower extremities with rest pain left lower extremity and foot  Post-operative diagnosis: Same  Procedure(s) Performed: 1. Introduction catheter into left lower extremity 3rd order catheter placement  2. Contrast injection left lower extremity for distal runoff   3. Crosser atherectomy of the left SFA and popliteal arteries 4.  Percutaneous transluminal angioplasty left superficial femoral artery and popliteal to 5 mm with Lutonix balloons             5.   Percutaneous transluminal angioplasty of the left peroneal to 2.5 mm             6.   Percutaneous transluminal and plasty and stent placement left common iliac artery with a LifeStar stent postdilated to 7 mm with Lutonix balloon             7.   Star close closure right common femoral arteriotomy  Anesthesia: Conscious sedation was administered under my direct supervision. IV Versed plus fentanyl were utilized. Continuous ECG, pulse oximetry and blood pressure was monitored throughout the entire procedure. Conscious sedation was for a total of 120 minutes.  Sheath: 6 [place] Ansell right common femoral artery  Contrast: 105 cc  Fluoroscopy Time: 17.8 minutes  Indications: Erin Greer presents with increasing rest pain of the left foot. During previous office visits we have discussed the option of angiography with intervention. On returning to the office most recently she has decided that her pain is so bad its time to do something. The risks and benefits are reviewed all questions answered patient agrees to proceed.  Procedure: Erin Greer is a 82 y.o. y.o. female who was identified and appropriate procedural time out was performed. The  patient was then placed supine on the table and prepped and draped in the usual sterile fashion.   Ultrasound was placed in the sterile sleeve and the right groin was evaluated the right common femoral artery was echolucent and pulsatile indicating patency.  Image was recorded for the permanent record and under real-time visualization a microneedle was inserted into the common femoral artery microwire followed by a micro-sheath.  A J-wire was then advanced through the micro-sheath and a  5 [place] sheath was then inserted over a J-wire. J-wire was then advanced and a 5 French pigtail catheter was positioned at the level of T12. AP projection of the aorta was then obtained. Pigtail catheter was repositioned to above the bifurcation and a RAO view of the pelvis was obtained.  Subsequently a pigtail catheter with the stiff angle Glidewire was used to cross the aortic bifurcation the catheter wire were advanced down into the left distal external iliac artery. Oblique view of the femoral bifurcation was then obtained and subsequently the wire was reintroduced and the pigtail catheter negotiated into the SFA representing third order catheter placement. Distal runoff was then performed.  5000 units of heparin was then given and allowed to circulate and a 6 [place] Ansell sheath was advanced up and over the bifurcation and positioned in the femoral artery  The 14 S Crosser catheter was then prepped on the field and a straight micro- catheter was advanced into the cul-de-sac of the SFA under magnified imaging in the LAO projection. Using the Crosser catheter the occlusion of the SFA and popliteal was negotiated. Hand injection through the wire port of the 14  S catheter of contrast was used to verify intraluminal placement distally. Straight micro- catheter and [place] core wire were then advanced down into the distal popliteal.  Distal runoff was then completed by hand injection through the catheter.      A 3 x 30  balloon was used to angioplasty the superficial femoral and popliteal arteries. Inflations were to 14 atmospheres for 2 minutes. Follow-up imaging demonstrated patency with adequate preparation of the vessel for a drug-coated balloon.  Subsequently, a 4 x 15 subsequently a 5 x 15 then a 5 x 10 then a 554 Lutonix balloons were utilized to angioplasty the SFA and popliteal beginning at the level of the knee and extending proximally area all inflations were to 12 atm for 2 full minutes. Follow-up imaging demonstrated adequate luminal gain without a flow limiting dissection. Distal runoff was then reassessed.  Distal runoff demonstrated short segment occlusion which was chronic of the peroneal and long segment occlusions of the posterior tibial and anterior tibial neither of which reconstituted. Using a V-18 and a CSI catheter the occlusion was crossed hand injection contrast verified patency of the peroneal down to the ankle. And subsequently the VAT wire was reintroduced and a 2.5 x 10 balloon was advanced across the lesion inflations to 14 atm for 2 minutes. The balloon was taken down repositioned slightly more proximally and a second inflation was performed. Follow-up imaging through the catheter demonstrated excellent result with less than 5% residual stenosis within the peroneal and tibioperoneal trunk.  The SFA and popliteal were then reassessed and found to be adequate. The sheath was then pulled into the proximal common iliac on the left and both RAO and LAO views of the common iliac artery were obtained the LAO demonstrating a string sign within the distal common iliac. Measurements were made and a 8 x 40 LifeStar stent was deployed across this lesion and then postdilated using a 7 x 40 Lutonix balloon. Follow-up imaging now demonstrated excellent result with near 0 residual stenosis.  After review of these images the sheath is pulled into the right external iliac oblique of the common femoral is  obtained and a Star close device deployed. There no immediate Complications.  Findings: The abdominal aorta is opacified with a bolus injection contrast. Renal arteries are patent. The aorta itself has diffuse disease but no hemodynamically significant lesions, there does appear to be dilatation of the distal aorta consistent with a small aneurysm. The common and external iliac arteries are widely patent on the right however on the left as noted above a string sign is identified in the left common iliac left external iliac is widely patent.  The left common femoral is widely patent as is the profunda femoris.  The SFA does indeed have a significant stenosis throughout its course which leads to an occlusion at Hunter's canal.  The distal popliteal demonstrates nonhemodynamically significant disease and the trifurcation is heavily diseased with occlusion of the anterior tibial and posterior tibial. The peroneal demonstrates a occlusion in its proximal portion   Following angioplasty to 2.5 mm the peroneal now shows in-line flow and looks quite nice. Angioplasty of the SFA at Hunter's canal yields an excellent result with less than 10% residual stenosis. PTA and stenting of the common iliac demonstrates complete resolution of this lesion as well    Disposition: Patient was taken to the recovery room in stable condition having tolerated the procedure well.  Schnier, Dolores Lory 10/13/2015,10:37 AM

## 2015-10-13 NOTE — H&P (Signed)
[facility] History & Physical Update  The patient was interviewed and re-examined.  The patient's previous History and Physical has been reviewed and is unchanged.  There is no change in the plan of care. We plan to proceed with the scheduled procedure.  Correy Weidner, Latina CraverGregory G, MD  10/13/2015, 10:17 AM

## 2015-10-13 NOTE — Discharge Instructions (Signed)
Angiogram, Care After °Refer to this sheet in the next few weeks. These instructions provide you with information about caring for yourself after your procedure. Your health care provider may also give you more specific instructions. Your treatment has been planned according to current medical practices, but problems sometimes occur. Call your health care provider if you have any problems or questions after your procedure. °WHAT TO EXPECT AFTER THE PROCEDURE °After your procedure, it is typical to have the following: °· Bruising at the catheter insertion site that usually fades within 1-2 weeks. °· Blood collecting in the tissue (hematoma) that may be painful to the touch. It should usually decrease in size and tenderness within 1-2 weeks. °HOME CARE INSTRUCTIONS °· Take medicines only as directed by your health care provider. °· You may shower 24-48 hours after the procedure or as directed by your health care provider. Remove the bandage (dressing) and gently wash the site with plain soap and water. Pat the area dry with a clean towel. Do not rub the site, because this may cause bleeding. °· Do not take baths, swim, or use a hot tub until your health care provider approves. °· Check your insertion site every day for redness, swelling, or drainage. °· Do not apply powder or lotion to the site. °· Do not lift over 10 lb (4.5 kg) for 5 days after your procedure or as directed by your health care provider. °· Ask your health care provider when it is okay to: °¨ Return to work or school. °¨ Resume usual physical activities or sports. °¨ Resume sexual activity. °· Do not drive home if you are discharged the same day as the procedure. Have someone else drive you. °· You may drive 24 hours after the procedure unless otherwise instructed by your health care provider. °· Do not operate machinery or power tools for 24 hours after the procedure or as directed by your health care provider. °· If your procedure was done as an  outpatient procedure, which means that you went home the same day as your procedure, a responsible adult should be with you for the first 24 hours after you arrive home. °· Keep all follow-up visits as directed by your health care provider. This is important. °SEEK MEDICAL CARE IF: °· You have a fever. °· You have chills. °· You have increased bleeding from the catheter insertion site. Hold pressure on the site. °SEEK IMMEDIATE MEDICAL CARE IF: °· You have unusual pain at the catheter insertion site. °· You have redness, warmth, or swelling at the catheter insertion site. °· You have drainage (other than a small amount of blood on the dressing) from the catheter insertion site. °· The catheter insertion site is bleeding, and the bleeding does not stop after 30 minutes of holding steady pressure on the site. °· The area near or just beyond the catheter insertion site becomes pale, cool, tingly, or numb. °  °This information is not intended to replace advice given to you by your health care provider. Make sure you discuss any questions you have with your health care provider. °  °Document Released: 02/03/2005 Document Revised: 08/08/2014 Document Reviewed: 12/19/2012 °Elsevier Interactive Patient Education ©2016 [employer] ° °

## 2015-10-14 ENCOUNTER — Encounter: Payer: Self-pay | Admitting: Vascular Surgery

## 2015-10-14 DIAGNOSIS — I70229 Atherosclerosis of native arteries of extremities with rest pain, unspecified extremity: Secondary | ICD-10-CM | POA: Diagnosis present

## 2015-10-14 LAB — GLUCOSE, CAPILLARY: Glucose-Capillary: 78 mg/dL (ref 65–99)

## 2015-10-14 MED ORDER — ASPIRIN EC 81 MG PO TBEC
81.0000 mg | DELAYED_RELEASE_TABLET | Freq: Every day | ORAL | Status: DC
  Administered 2015-10-14: 81 mg via ORAL
  Filled 2015-10-14: qty 1

## 2015-10-14 MED ORDER — ACETAMINOPHEN 325 MG PO TABS: 325.0000 mg | ORAL_TABLET | ORAL | Status: DC | PRN

## 2015-10-14 MED ORDER — ASPIRIN 81 MG PO TBEC: 81.0000 mg | DELAYED_RELEASE_TABLET | Freq: Every day | ORAL | Status: DC

## 2015-10-14 MED ORDER — CLOPIDOGREL BISULFATE 75 MG PO TABS: 75.0000 mg | ORAL_TABLET | Freq: Every day | ORAL | Status: DC

## 2015-10-14 NOTE — Progress Notes (Addendum)
Right femoral PAD deflated 40 ml air per policy, (+) capillary refill, reinflated to previous amount.

## 2015-10-14 NOTE — Progress Notes (Signed)
[facility]  Daily Progress Note   Subjective: 1 Day Post-Op: Introduction catheter into left lower extremity 3rd order catheter placement, Contrast injection left lower extremity for distal runoff,  Crosser atherectomy of the left SFA and popliteal arteries, Percutaneous transluminal angioplasty left superficial femoral artery and popliteal to 5 mm with Lutonix balloons, Percutaneous transluminal angioplasty of the left peroneal to 2.5 mm, Percutaneous transluminal and plasty and stent placement left common iliac artery with a LifeStar stent postdilated to 7 mm with Lutonix balloon and Star close closure right common femoral arteriotomy  Daughter at bedside. Patient without complaint. No issues overnight. States the pain in her legs is improved.   Objective: Filed Vitals:   10/14/15 0400 10/14/15 0500 10/14/15 0600 10/14/15 0700  BP: 179/62 167/71 168/61 165/63  Pulse: 60 60 63 58  Temp:    98.5 F (36.9 C)  TempSrc:    Oral  Resp: 13 14 12 15   Height:      Weight:      SpO2: 100% 100% 99% 100%    Intake/Output Summary (Last 24 hours) at 10/14/15 0846 Last data filed at 10/14/15 0700  Gross per 24 hour  Intake  112.6 ml  Output    650 ml  Net -537.4 ml    Physical Exam: A&Ox3, NAD CV: RRR Pulmonary: CTA Bilaterally Abdomen: Soft, Nontender, Nondistended Groin: Incision healing well, no drainage or swelling noted Vascular:  Left Lower Extremity: tender to palpation, warm, minimal edema  Right Lower Extremity: warm, minimal edema   Laboratory: CBC    Component Value Date/Time   WBC 5.7 01/26/2013 1210   HGB 13.2 01/26/2013 1210   HCT 40.2 01/26/2013 1210   PLT 234 01/26/2013 1210   BMET    Component Value Date/Time   NA 141 01/26/2013 1210   K 4.1 01/26/2013 1210   CL 100 01/26/2013 1210   CO2 33* 01/26/2013 1210   GLUCOSE 141* 01/26/2013 1210   BUN 14 10/13/2015 0705   BUN 11 01/26/2013 1210   CREATININE 0.65 10/13/2015 0705   CREATININE 0.82 01/26/2013 1210   CALCIUM 11.2* 01/26/2013 1210   GFRNONAA >60 10/13/2015 0705   GFRNONAA >60 01/26/2013 1210   GFRAA >60 10/13/2015 0705   GFRAA >60 01/26/2013 1210   Assessment/Planning: 82 year old female s/p left lower extremity angiogram with intervention for rest pain - improvement in symptoms, no issues over night. 1) OK to discharge patient home 2) Discussed with Dr. Charlie PitterSchnier  Donyel Nester [facility] PA-C 10/14/2015 8:46 AM

## 2015-10-14 NOTE — Care Management (Signed)
Assessed and there are no discharge needs

## 2015-10-14 NOTE — Progress Notes (Signed)
PAD deflated and removed.  Pos cap refill.  No bleed noted.

## 2015-10-14 NOTE — Progress Notes (Signed)
D/c home per md order, vss, instructions and prescriptions given, iv d/c, escorted out via wheelchair by family and auxillary

## 2015-10-14 NOTE — Progress Notes (Signed)
Up and ambulated around unit x 1, tolerated well

## 2015-10-14 NOTE — Progress Notes (Addendum)
Right femoral PAD deflated 30 ml air per policy, (+) capillary refill, reinflated to previous amount. 

## 2015-10-19 ENCOUNTER — Other Ambulatory Visit: Payer: Self-pay | Admitting: Vascular Surgery

## 2015-10-21 NOTE — Discharge Summary (Signed)
[facility]    Discharge Summary    Patient ID:  Erin Greer MRN: [id number] DOB/AGE: 1927-11-10 82 y.o.  Admit date: 10/13/2015 Discharge date: 10/21/2015 Date of Surgery: 10/13/2015 Surgeon: Surgeon(s): Renford Dills, MD  Admission Diagnosis: ASO with Claudication Lt Let  Discharge Diagnoses:  ASO with Claudication Lt Let  Secondary Diagnoses: Past Medical History  Diagnosis Date  . Hypertension     Procedure(s): Abdominal Aortogram w/Lower Extremity Lower Extremity Intervention  Discharged Condition: good  HPI:  Erin Greer presented with increasing rest pain of the left foot. On 10/13/15, she underwent ntroduction catheter into left lower extremity 3rd order catheter placement, Contrast injection left lower extremity for distal runoff, Crosser atherectomy of the left SFA and popliteal arteries, Percutaneous transluminal angioplasty left superficial femoral artery and popliteal to 5 mm with Lutonix balloons, Percutaneous transluminal angioplasty of the left peroneal to 2.5 mm, Percutaneous transluminal and plasty and stent placement left common iliac artery with a LifeStar stent postdilated to 7 mm with Lutonix balloon and Star close closure right common femoral arteriotomy. She tolerated the procedure and was transferred from the PACU to surgical floor without complication. She was admitted and placed on Aggrastat overnight to medically optimize her endovascular intervention. Upon discharge, the patient was tolerating a regular diet, her pain was controlled with PO meds, voiding without issue and ambulating independently.   Hospital Course:  Erin Greer is a 82 y.o. female is S/P Left Procedure(s): Abdominal Aortogram w/Lower Extremity Lower Extremity Intervention  Extubated: POD # 0  Physical exam:  A&Ox3, NAD CV: RRR Pulmonary: CTA Bilaterally Abdomen: Soft, Nontender, Nondistended Groin: Incision  healing well, no drainage or swelling noted Vascular: Left Lower Extremity: tender to palpation, warm, minimal edema Right Lower Extremity: warm, minimal edema  Post-op wounds clean, dry, intact or healing well Pt. Ambulating, voiding and taking PO diet without difficulty. Pt pain controlled with PO pain meds.  Labs as below  Complications:none  Consults:   None  Significant Diagnostic Studies: CBC Lab Results  Component Value Date   WBC 5.7 01/26/2013   HGB 13.2 01/26/2013   HCT 40.2 01/26/2013   MCV 91 01/26/2013   PLT 234 01/26/2013    BMET    Component Value Date/Time   NA 141 01/26/2013 1210   K 4.1 01/26/2013 1210   CL 100 01/26/2013 1210   CO2 33* 01/26/2013 1210   GLUCOSE 141* 01/26/2013 1210   BUN 14 10/13/2015 0705   BUN 11 01/26/2013 1210   CREATININE 0.65 10/13/2015 0705   CREATININE 0.82 01/26/2013 1210   CALCIUM 11.2* 01/26/2013 1210   GFRNONAA >60 10/13/2015 0705   GFRNONAA >60 01/26/2013 1210   GFRAA >60 10/13/2015 0705   GFRAA >60 01/26/2013 1210   COAG No results found for: INR, PROTIME   Disposition:  Discharge to :Home    Medication List    TAKE these medications        acetaminophen 325 MG tablet  Commonly known as:  TYLENOL  Take 1-2 tablets (325-650 mg total) by mouth every 4 (four) hours as needed for mild pain (or temp >/= 101 F).     aspirin 81 MG EC tablet  Take 1 tablet (81 mg total) by mouth daily.     brimonidine 0.15 % ophthalmic solution  Commonly known as:  ALPHAGAN  Place 1 drop into both eyes 2 (two) times daily.     clopidogrel 75 MG tablet  Commonly known as:  PLAVIX  Take 1 tablet (75 mg total) by mouth daily with breakfast.     dorzolamide 2 % ophthalmic solution  Commonly known as:  TRUSOPT  Place 1 drop into both eyes 2 (two) times daily.     irbesartan-hydrochlorothiazide 150-12.5 MG tablet  Commonly known as:  AVALIDE  Take 1 tablet by mouth daily.     multivitamin with  minerals tablet  Take 1 tablet by mouth daily.       Verbal and written Discharge instructions given to the patient. Wound care per Discharge AVS Follow-up Information    Follow up with Schnier, Latina CraverGregory G, MD.   Specialties:  Vascular Surgery, Cardiology, Radiology, Vascular Surgery   Why:  ABI and Left Lower Extremity Arterial Duplex   Contact information:   [address] [place] [id number] [phone]       Signed: Tonette LedererKIMBERLY A Dajaun Goldring, PA-C  10/21/2015, 3:25 PM

## 2015-11-09 ENCOUNTER — Other Ambulatory Visit: Payer: Self-pay | Admitting: Family Medicine

## 2015-11-09 DIAGNOSIS — K7689 Other specified diseases of liver: Secondary | ICD-10-CM

## 2015-11-16 ENCOUNTER — Ambulatory Visit
Admission: RE | Admit: 2015-11-16 | Discharge: 2015-11-16 | Disposition: A | Discharge status: Home / Self Care | Payer: Medicare Other | Source: Ambulatory Visit | Attending: Family Medicine | Admitting: Family Medicine

## 2015-11-16 DIAGNOSIS — K824 Cholesterolosis of gallbladder: Secondary | ICD-10-CM | POA: Insufficient documentation

## 2015-11-16 DIAGNOSIS — K7689 Other specified diseases of liver: Secondary | ICD-10-CM | POA: Diagnosis not present

## 2016-05-24 ENCOUNTER — Other Ambulatory Visit: Payer: Self-pay | Admitting: Family Medicine

## 2016-05-24 DIAGNOSIS — Z780 Asymptomatic menopausal state: Secondary | ICD-10-CM

## 2016-06-07 ENCOUNTER — Other Ambulatory Visit: Payer: Self-pay | Admitting: Family Medicine

## 2016-06-07 DIAGNOSIS — Z1231 Encounter for screening mammogram for malignant neoplasm of breast: Secondary | ICD-10-CM

## 2016-06-16 ENCOUNTER — Ambulatory Visit
Admission: RE | Admit: 2016-06-16 | Discharge: 2016-06-16 | Disposition: A | Discharge status: Home / Self Care | Payer: Medicare Other | Source: Ambulatory Visit | Attending: Family Medicine | Admitting: Family Medicine

## 2016-06-16 ENCOUNTER — Other Ambulatory Visit: Payer: Self-pay | Admitting: Family Medicine

## 2016-06-16 DIAGNOSIS — M810 Age-related osteoporosis without current pathological fracture: Secondary | ICD-10-CM | POA: Diagnosis not present

## 2016-06-16 DIAGNOSIS — Z1231 Encounter for screening mammogram for malignant neoplasm of breast: Secondary | ICD-10-CM | POA: Insufficient documentation

## 2016-06-16 DIAGNOSIS — Z780 Asymptomatic menopausal state: Secondary | ICD-10-CM | POA: Insufficient documentation

## 2016-07-28 ENCOUNTER — Encounter (INDEPENDENT_AMBULATORY_CARE_PROVIDER_SITE_OTHER): Payer: Medicare Other

## 2016-07-28 ENCOUNTER — Ambulatory Visit (INDEPENDENT_AMBULATORY_CARE_PROVIDER_SITE_OTHER): Payer: Self-pay | Admitting: Vascular Surgery

## 2016-08-22 DIAGNOSIS — M810 Age-related osteoporosis without current pathological fracture: Secondary | ICD-10-CM | POA: Insufficient documentation

## 2017-05-25 ENCOUNTER — Other Ambulatory Visit: Payer: Self-pay | Admitting: Family Medicine

## 2017-05-25 DIAGNOSIS — M810 Age-related osteoporosis without current pathological fracture: Secondary | ICD-10-CM

## 2017-11-07 ENCOUNTER — Other Ambulatory Visit: Payer: Self-pay | Admitting: Family Medicine

## 2017-11-07 DIAGNOSIS — K824 Cholesterolosis of gallbladder: Secondary | ICD-10-CM

## 2017-11-07 DIAGNOSIS — I714 Abdominal aortic aneurysm, without rupture, unspecified: Secondary | ICD-10-CM

## 2017-11-07 DIAGNOSIS — I10 Essential (primary) hypertension: Secondary | ICD-10-CM

## 2017-11-13 ENCOUNTER — Ambulatory Visit
Admission: RE | Admit: 2017-11-13 | Discharge: 2017-11-13 | Disposition: A | Discharge status: Home / Self Care | Payer: Medicare Other | Source: Ambulatory Visit | Attending: Family Medicine | Admitting: Family Medicine

## 2017-11-13 DIAGNOSIS — K824 Cholesterolosis of gallbladder: Secondary | ICD-10-CM | POA: Diagnosis present

## 2017-11-13 DIAGNOSIS — K7689 Other specified diseases of liver: Secondary | ICD-10-CM | POA: Insufficient documentation

## 2020-03-09 DIAGNOSIS — R413 Other amnesia: Secondary | ICD-10-CM | POA: Insufficient documentation

## 2020-04-15 DIAGNOSIS — Z8673 Personal history of transient ischemic attack (TIA), and cerebral infarction without residual deficits: Secondary | ICD-10-CM | POA: Insufficient documentation

## 2020-05-10 ENCOUNTER — Emergency Department: Payer: Medicare Other

## 2020-05-10 ENCOUNTER — Emergency Department
Admission: EM | Admit: 2020-05-10 | Discharge: 2020-05-10 | Disposition: A | Discharge status: Home / Self Care | Payer: Medicare Other | Attending: Emergency Medicine | Admitting: Emergency Medicine

## 2020-05-10 ENCOUNTER — Other Ambulatory Visit: Payer: Self-pay

## 2020-05-10 DIAGNOSIS — Z7982 Long term (current) use of aspirin: Secondary | ICD-10-CM | POA: Insufficient documentation

## 2020-05-10 DIAGNOSIS — R479 Unspecified speech disturbances: Secondary | ICD-10-CM | POA: Diagnosis present

## 2020-05-10 DIAGNOSIS — Z79899 Other long term (current) drug therapy: Secondary | ICD-10-CM | POA: Diagnosis not present

## 2020-05-10 DIAGNOSIS — I639 Cerebral infarction, unspecified: Secondary | ICD-10-CM

## 2020-05-10 DIAGNOSIS — R569 Unspecified convulsions: Secondary | ICD-10-CM | POA: Insufficient documentation

## 2020-05-10 DIAGNOSIS — I10 Essential (primary) hypertension: Secondary | ICD-10-CM | POA: Insufficient documentation

## 2020-05-10 DIAGNOSIS — I6782 Cerebral ischemia: Secondary | ICD-10-CM | POA: Insufficient documentation

## 2020-05-10 DIAGNOSIS — I739 Peripheral vascular disease, unspecified: Secondary | ICD-10-CM | POA: Insufficient documentation

## 2020-05-10 DIAGNOSIS — G319 Degenerative disease of nervous system, unspecified: Secondary | ICD-10-CM | POA: Diagnosis not present

## 2020-05-10 DIAGNOSIS — R531 Weakness: Secondary | ICD-10-CM | POA: Diagnosis not present

## 2020-05-10 DIAGNOSIS — E785 Hyperlipidemia, unspecified: Secondary | ICD-10-CM | POA: Insufficient documentation

## 2020-05-10 LAB — COMPREHENSIVE METABOLIC PANEL
ALT: 11 U/L (ref 0–44)
AST: 17 U/L (ref 15–41)
Albumin: 4.4 g/dL (ref 3.5–5.0)
Alkaline Phosphatase: 71 U/L (ref 38–126)
Anion gap: 12 (ref 5–15)
BUN: 17 mg/dL (ref 8–23)
CO2: 26 mmol/L (ref 22–32)
Calcium: 9.7 mg/dL (ref 8.9–10.3)
Chloride: 103 mmol/L (ref 98–111)
Creatinine, Ser: 0.78 mg/dL (ref 0.44–1.00)
GFR, Estimated: >60 mL/min (ref >60)
Glucose, Bld: 112 mg/dL — ABNORMAL HIGH (ref 70–99)
Potassium: 3.8 mmol/L (ref 3.5–5.1)
Sodium: 141 mmol/L (ref 135–145)
Total Bilirubin: 0.8 mg/dL (ref 0.3–1.2)
Total Protein: 7.4 g/dL (ref 6.5–8.1)

## 2020-05-10 LAB — CBC
HCT: 37.9 % (ref 36.0–46.0)
Hemoglobin: 12.2 g/dL (ref 12.0–15.0)
MCH: 29.6 pg (ref 26.0–34.0)
MCHC: 32.2 g/dL (ref 30.0–36.0)
MCV: 92 fL (ref 80.0–100.0)
Platelets: 216 10*3/uL (ref 150–400)
RBC: 4.12 MIL/uL (ref 3.87–5.11)
RDW: 13.2 % (ref 11.5–15.5)
WBC: 5.3 10*3/uL (ref 4.0–10.5)
nRBC: 0 % (ref 0.0–0.2)

## 2020-05-10 LAB — DIFFERENTIAL
Abs Immature Granulocytes: 0.01 10*3/uL (ref 0.00–0.07)
Basophils Absolute: 0 10*3/uL (ref 0.0–0.1)
Basophils Relative: 0 %
Eosinophils Absolute: 0.3 10*3/uL (ref 0.0–0.5)
Eosinophils Relative: 5 %
Immature Granulocytes: 0 %
Lymphocytes Relative: 30 %
Lymphs Abs: 1.6 10*3/uL (ref 0.7–4.0)
Monocytes Absolute: 0.5 10*3/uL (ref 0.1–1.0)
Monocytes Relative: 9 %
Neutro Abs: 3 10*3/uL (ref 1.7–7.7)
Neutrophils Relative %: 56 %

## 2020-05-10 LAB — LIPID PANEL
Cholesterol: 302 mg/dL — ABNORMAL HIGH (ref 0–200)
HDL: 59 mg/dL (ref >40)
LDL Cholesterol: 227 mg/dL — ABNORMAL HIGH (ref 0–99)
Total CHOL/HDL Ratio: 5.1 RATIO
Triglycerides: 81 mg/dL (ref <150)
VLDL: 16 mg/dL (ref 0–40)

## 2020-05-10 LAB — HEMOGLOBIN A1C
Hgb A1c MFr Bld: 6.4 % — ABNORMAL HIGH (ref 4.8–5.6)
Mean Plasma Glucose: 136.98 mg/dL

## 2020-05-10 LAB — PROTIME-INR
INR: 0.9 (ref 0.8–1.2)
Prothrombin Time: 12.1 seconds (ref 11.4–15.2)

## 2020-05-10 LAB — APTT: aPTT: <24 seconds — ABNORMAL LOW (ref 24–36)

## 2020-05-10 LAB — GLUCOSE, CAPILLARY: Glucose-Capillary: 100 mg/dL — ABNORMAL HIGH (ref 70–99)

## 2020-05-10 MED ORDER — CLOPIDOGREL BISULFATE 75 MG PO TABS
300.0000 mg | ORAL_TABLET | Freq: Every day | ORAL | Status: DC
  Administered 2020-05-10: 300 mg via ORAL
  Filled 2020-05-10: qty 4

## 2020-05-10 MED ORDER — GADOBUTROL 1 MMOL/ML IV SOLN: 5.0000 mL | Freq: Once | INTRAVENOUS | Status: DC | PRN

## 2020-05-10 MED ORDER — CLOPIDOGREL BISULFATE 75 MG PO TABS: 75.0000 mg | ORAL_TABLET | Freq: Every day | ORAL | 0 refills | Status: AC

## 2020-05-10 MED ORDER — CLOPIDOGREL BISULFATE 75 MG PO TABS: 75.0000 mg | ORAL_TABLET | Freq: Every day | ORAL | Status: DC

## 2020-05-10 NOTE — ED Provider Notes (Signed)
[facility] Emergency Department Provider Note   ____________________________________________    I have reviewed the triage vital signs and the nursing notes.   HISTORY  Chief Complaint Code Stroke     HPI Varonica Siharath is a 86 y.o. female with a history of high blood pressure, peripheral vascular disease who presents with speech difficulties and right hand weakness.  No reports of headache, last known well 2:30 PM.  Code stroke called from triage  Past Medical History:  Diagnosis Date  . Hypertension     Patient Active Problem List   Diagnosis Date Noted  . Atherosclerosis of artery of extremity with rest pain (HCC) 10/14/2015  . 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 ABDOMINAL HYSTERECTOMY    . PERIPHERAL VASCULAR CATHETERIZATION N/A 10/13/2015   Procedure: Abdominal Aortogram w/Lower Extremity;  Surgeon: Renford Dills, MD;  Location: [facility] INVASIVE CV LAB;  Service: Cardiovascular;  Laterality: N/A;  . PERIPHERAL VASCULAR CATHETERIZATION  10/13/2015   Procedure: Lower Extremity Intervention;  Surgeon: Renford Dills, MD;  Location: [facility] INVASIVE CV LAB;  Service: Cardiovascular;;    Prior to Admission medications   Medication Sig Start Date End Date Taking? Authorizing Provider  ASPIRIN 81 81 MG EC tablet Take 81 mg by mouth daily. 03/09/20  Yes [provider]  brimonidine (ALPHAGAN) 0.15 % ophthalmic solution Place 1 drop into both eyes 2 (two) times daily.   Yes [provider]  dorzolamide (TRUSOPT) 2 % ophthalmic solution Place 1 drop into both eyes 2 (two) times daily.   Yes [provider]  irbesartan-hydrochlorothiazide (AVALIDE) 150-12.5 MG per tablet Take 1 tablet by mouth daily.   Yes [provider]  vitamin B-12 (CYANOCOBALAMIN) 1000 MCG tablet Take 1,000 mcg by mouth  daily. 05/07/20  Yes [provider]  acetaminophen (TYLENOL) 325 MG tablet Take 1-2 tablets (325-650 mg total) by mouth every 4 (four) hours as needed for mild pain (or temp >/= 101 F). Patient not taking: Reported on 05/10/2020 10/14/15   Stegmayer, Ranae Plumber, PA-C  clopidogrel (PLAVIX) 75 MG tablet Take 1 tablet (75 mg total) by mouth daily for 21 days. 05/10/20 05/31/20  Jene Every, MD  Multiple Vitamins-Minerals (MULTIVITAMIN WITH MINERALS) tablet Take 1 tablet by mouth daily. Patient not taking: Reported on 05/10/2020    [provider]     Allergies Patient has no known allergies.  Family History  Problem Relation Age of Onset  . Breast cancer Neg Hx     Social History Social History   Tobacco Use  . Smoking status: Never Smoker  . Smokeless tobacco: Never Used  Substance Use Topics  . Alcohol use: No  . Drug use: No    10 point review of Systems otherwise normal   Neurological: As above   ____________________________________________   PHYSICAL EXAM:  VITAL SIGNS: ED Triage Vitals  Enc Vitals Group     BP 05/10/20 1629 (!) 156/61     Pulse Rate 05/10/20 1629 (!) 3     Resp 05/10/20 1629 18     Temp 05/10/20 1629 98.3 F (36.8 C)     Temp Source 05/10/20 1629 Oral     SpO2 05/10/20 1629 97 %     Weight --      Height --      Head Circumference --      Peak Flow --  Pain Score 05/10/20 1630 0     Pain Loc --      Pain Edu? --      Excl. in GC? --     Constitutional: Alert and oriented.  Eyes: PERRLA, EOMI   Cardiovascular: Normal rate, regular rhythm. Respiratory: Normal respiratory effort.    Musculoskeletal:  Warm and well perfused Neurologic: Neurology exam performed by neurologist Skin:  Skin is warm, dry and intact. No rash noted. Psychiatric: Mood and affect are normal.   ____________________________________________   LABS (all labs ordered are listed, but only abnormal results are displayed)  Labs Reviewed   GLUCOSE, CAPILLARY - Abnormal; Notable for the following components:      Result Value   Glucose-Capillary 100 (*)    All other components within normal limits  APTT - Abnormal; Notable for the following components:   aPTT <24 (*)    All other components within normal limits  COMPREHENSIVE METABOLIC PANEL - Abnormal; Notable for the following components:   Glucose, Bld 112 (*)    All other components within normal limits  LIPID PANEL - Abnormal; Notable for the following components:   Cholesterol 302 (*)    LDL Cholesterol 227 (*)    All other components within normal limits  PROTIME-INR  CBC  DIFFERENTIAL  HEMOGLOBIN A1C  CBG MONITORING, ED   ____________________________________________  EKG   ____________________________________________  RADIOLOGY  CT head, contacted by radiologist, no acute abnormality ____________________________________________   PROCEDURES  Procedure(s) performed: No  Procedures   Critical Care performed: No ____________________________________________   INITIAL IMPRESSION / ASSESSMENT AND PLAN / ED COURSE  Pertinent labs & imaging results that were available during my care of the patient were reviewed by me and considered in my medical decision making (see chart for details).  Patient presented with right hand weakness, slurred speech as above, code stroke called.  Patient seen rapidly by neurology, who recommended MRI, MRA, feels likely more focal seizure no indication for TPA.  MRI results are reassuring, neurology recommends 300 mg Plavix, 75 mg x 21 days, close outpatient follow-up with neurology.    ____________________________________________   FINAL CLINICAL IMPRESSION(S) / ED DIAGNOSES  Final diagnoses:  Focal seizure (HCC)        Note:  This document was prepared using Dragon voice recognition software and may include unintentional dictation errors.   Jene Every, MD 05/10/20 2225

## 2020-05-10 NOTE — ED Notes (Signed)
Patient self removed IV per daughter, she ripped it out. No bleeding upon assessment. Daughter had bandaged site. Clean and dry.

## 2020-05-10 NOTE — ED Triage Notes (Signed)
Pt able to ambulate but does need some assistance.   Pt had difficulty in CT using right hand to put glasses on.  Pt does have some confusion at baseline.

## 2020-05-10 NOTE — ED Triage Notes (Addendum)
Pt arrived via POV with daughter, reports LKW at 1pm and 230pm  Daughter states the pt started having difficulty walking and reporting that she cannot feel her R side and was unable to hold coffee cup.  Daughter states at 230pm the pt was dancing in front of family members.  Pt has hx of TIA in the past.   Daughter states when she was dressing the patient prior to arrival the patient was having speech difficulties.

## 2020-05-10 NOTE — Consult Note (Addendum)
Neurology Consultation Reason for Consult: Right sided weakness Referring Physician: Jene Every   CC: Right hand weakness  History is obtained from: Daughter Erin Greer, patient and chart review  HPI: Erin Greer is a 86 y.o. right-handed woman with past medical history significant for hypertension, hyperlipidemia, probable dementia, peripheral vascular disease, glaucoma.  The patient was eating her breakfast in the early mid afternoon, as a family social breakfast based on whenever she wakes up.  She suddenly began to have difficulty putting her fried egg onto her piece of toast as well as difficulty holding her coffee cup with her right hand.  This prompted family to come to the emergency room for further evaluation.  Of note when family demonstrated the hand movements that she was making, the imitation of her movements appeared very repetitive in nature.  They also report that she was rubbing her hand just prior to the starting.  They reported that this is very similar to an episode in July, where the patient had been found down and overheated, and are quite clear that this was a right-sided weakness event.  Conversely, per [facility] notes in the electronic medical record, the patient had LEFT sided weakness in July.  Those symptoms also rapidly improved, and the patient was discharged with the family preferred to complete her work-up outpatient.  Per family and chart review, at baseline the patient is somewhat resistant to taking many of her medications, though daughter reports that the patient is now taking her antihypertensive medications, but adamantly refuses statin medications due to a severe reaction that her friend had.  The patient has a disrupted sleep-wake cycle, often wandering in the night, which the family manages with dead bolts and careful unplugging of any potentially dangerous appliances such as the toaster oven.  The patient is resistant to being diagnosed with dementia and  does not want evaluation for this.  Review of systems is provided by the daughter is pan negative, including no headaches, obvious changes in her vision or hearing, fevers, chills, cough, cold, rashes, new urinary incontinence (this is baseline), other focal weakness not described above  Right shoulder pain in 07/04/2019 and 07/25/2019  LKW: 2:40 PM tPA given?: No, as patient was back to baseline  Past Medical History:  Diagnosis Date  . Hypertension    Past Medical History:  Diagnosis Date  . Hypertension    Family History  Problem Relation Age of Onset  . Breast cancer Neg Hx    Social History:  reports that she has never smoked. She has never used smokeless tobacco. She reports that she does not drink alcohol and does not use drugs.  Exam: Current vital signs: BP (!) 156/61 (BP Location: Left Arm)   Pulse (!) 3   Temp 98.3 F (36.8 C) (Oral)   Resp 18   SpO2 97%  Vital signs in last 24 hours: Temp:  [98.3 F (36.8 C)] 98.3 F (36.8 C) (10/10 1629) Pulse Rate:  [3] 3 (10/10 1629) Resp:  [18] 18 (10/10 1629) BP: (156)/(61) 156/61 (10/10 1629) SpO2:  [97 %] 97 % (10/10 1629)   Physical Exam  Constitutional: Appears well-developed and thin but well-nourished Psych: Affect slightly labile (threatens to hit me when I asked her to make a fist, but quite pleasant and smiling other times) Eyes: No scleral injection HENT: No OP obstruction MSK: no joint deformities.  Cardiovascular: Normal rate and regular rhythm.  Respiratory: Effort normal, non-labored breathing GI: Soft.  No distension. There is no tenderness.  Skin: WDI  Neuro: Mental Status: Patient is awake, alert, oriented to person but not place, month, year or situation Patient is unable to give a clear history Patient does have some word finding difficulties, but is able to repeat and follow simple commands Cranial Nerves: II: Visual Fields are full. Pupils are equal, round, and reactive to light.   III,IV, VI: EOMI without ptosis or diploplia.  V: Facial sensation is symmetric to temperature VII: Facial movement is symmetric.  VIII: hearing is intact to voice X: Uvula elevates symmetrically XI: Shoulder shrug is symmetric. XII: tongue is midline without atrophy or fasciculations.  Motor: 5/5 strength was present in all four extremities.  Sensory: Sensation is symmetric to light touch and temperature in the arms and legs. Deep Tendon Reflexes: 2+ and symmetric in the biceps and patellae.  Cerebellar: FNF with mild tremor bilaterally, left greater than right and HKS are intact bilaterally  I have reviewed labs in epic and the results pertinent to this consultation are: Lipid Panel     Component Value Date/Time   CHOL 302 (H) 05/10/2020 1640   TRIG 81 05/10/2020 1640   HDL 59 05/10/2020 1640   CHOLHDL 5.1 05/10/2020 1640   VLDL 16 05/10/2020 1640   LDLCALC 227 (H) 05/10/2020 1640   Lab Results  Component Value Date   HGBA1C 6.4 (H) 05/10/2020    Cr 0.78  I have reviewed the images obtained: Dry head CT without acute intracranial process MRI brain with a small acute infarction in the right parietal corona radiata  Impression: This is a 86 year old woman with vascular risk factors as above presenting with an acute stroke; likely lacunar based on its location, possibly stuttering in time course.  Seizure and underlying mass is considered as an alternative etiology, but imaging did not support this possibility based on review with radiology.  Given her dementia, family did not feel that hospitalization was within the patient's goals of care.  Although they refused cholesterol medication, they were open to her taking Plavix again for a short course as per POINT/CHANCE trials.   Recommendations: - Family / patient refuses cholesterol medication  - Prefer to complete workup outpatient due to patient's dementia and confusion  - Agreeable to Plavix 300 mg load here and 75 mg  daily x 21 days, request script to [facility] in [place]  - Close outpatient follow-up with neurologist for consideration of repeat ECHO vs. holding given recent result in August and discussion about usefulness of monitoring --  (due to her tendency to rip off monitors)   Brooke Dare MD-PhD [facility] [phone]

## 2020-10-17 ENCOUNTER — Encounter: Payer: Self-pay | Admitting: Emergency Medicine

## 2020-10-17 ENCOUNTER — Other Ambulatory Visit: Payer: Self-pay

## 2020-10-17 ENCOUNTER — Emergency Department
Admission: EM | Admit: 2020-10-17 | Discharge: 2020-10-18 | Disposition: A | Discharge status: Home / Self Care | Payer: Medicare Other | Attending: Emergency Medicine | Admitting: Emergency Medicine

## 2020-10-17 DIAGNOSIS — R404 Transient alteration of awareness: Secondary | ICD-10-CM | POA: Insufficient documentation

## 2020-10-17 DIAGNOSIS — Z79899 Other long term (current) drug therapy: Secondary | ICD-10-CM | POA: Diagnosis not present

## 2020-10-17 DIAGNOSIS — R4182 Altered mental status, unspecified: Secondary | ICD-10-CM | POA: Diagnosis present

## 2020-10-17 DIAGNOSIS — I10 Essential (primary) hypertension: Secondary | ICD-10-CM | POA: Insufficient documentation

## 2020-10-17 DIAGNOSIS — Z7982 Long term (current) use of aspirin: Secondary | ICD-10-CM | POA: Diagnosis not present

## 2020-10-17 LAB — CBC
HCT: 38.5 % (ref 36.0–46.0)
Hemoglobin: 12.7 g/dL (ref 12.0–15.0)
MCH: 30.6 pg (ref 26.0–34.0)
MCHC: 33 g/dL (ref 30.0–36.0)
MCV: 92.8 fL (ref 80.0–100.0)
Platelets: 269 10*3/uL (ref 150–400)
RBC: 4.15 MIL/uL (ref 3.87–5.11)
RDW: 12.4 % (ref 11.5–15.5)
WBC: 5.9 10*3/uL (ref 4.0–10.5)
nRBC: 0 % (ref 0.0–0.2)

## 2020-10-17 LAB — CBG MONITORING, ED: Glucose-Capillary: 102 mg/dL — ABNORMAL HIGH (ref 70–99)

## 2020-10-17 NOTE — ED Triage Notes (Signed)
Pt presents to ER via EMS from home, per EMS pt's family was concerned that pt is more confused than usual tonight. Pt has history  Dementia, pt unable to respond questions asked like date of birth, name and place. Pt's family informed EMS new BP medication on 3/15. Per EMS family informed pt has had strokes. Pt is calm, strong grip bilaterally, Able to lift both legs.

## 2020-10-18 ENCOUNTER — Emergency Department: Payer: Medicare Other

## 2020-10-18 DIAGNOSIS — R404 Transient alteration of awareness: Secondary | ICD-10-CM | POA: Diagnosis not present

## 2020-10-18 LAB — COMPREHENSIVE METABOLIC PANEL
ALT: 18 U/L (ref 0–44)
AST: 19 U/L (ref 15–41)
Albumin: 4.5 g/dL (ref 3.5–5.0)
Alkaline Phosphatase: 69 U/L (ref 38–126)
Anion gap: 12 (ref 5–15)
BUN: 33 mg/dL — ABNORMAL HIGH (ref 8–23)
CO2: 24 mmol/L (ref 22–32)
Calcium: 9.8 mg/dL (ref 8.9–10.3)
Chloride: 100 mmol/L (ref 98–111)
Creatinine, Ser: 1.2 mg/dL — ABNORMAL HIGH (ref 0.44–1.00)
GFR, Estimated: 43 mL/min — ABNORMAL LOW (ref >60)
Glucose, Bld: 123 mg/dL — ABNORMAL HIGH (ref 70–99)
Potassium: 3.7 mmol/L (ref 3.5–5.1)
Sodium: 136 mmol/L (ref 135–145)
Total Bilirubin: 0.8 mg/dL (ref 0.3–1.2)
Total Protein: 7.6 g/dL (ref 6.5–8.1)

## 2020-10-18 MED ORDER — SODIUM CHLORIDE 0.9 % IV BOLUS
500.0000 mL | Freq: Once | INTRAVENOUS | Status: AC
  Administered 2020-10-18: 500 mL via INTRAVENOUS

## 2020-10-18 NOTE — Discharge Instructions (Signed)
Your workup in the Emergency Department today was reassuring.  We did not find any specific abnormalities.  We recommend you drink plenty of fluids, take your regular medications and/or any new ones prescribed today, and follow up with the doctor(s) listed in these documents as recommended.  Return to the Emergency Department if you develop new or worsening symptoms that concern you.  

## 2020-10-18 NOTE — ED Provider Notes (Signed)
[facility] Emergency Department Provider Note  ____________________________________________   Event Date/Time   First MD Initiated Contact with Patient 10/17/20 2346     (approximate)  I have reviewed the triage vital signs and the nursing notes.   HISTORY  Chief Complaint Altered Mental Status  Level 5 caveat:  history/ROS limited by chronic dementia  HPI Erin Greer is a 87 y.o. female with medical history as listed below who presents by EMS for evaluation of altered mental status.  Reportedly the patient lives at home with her daughter.  Her daughter told the paramedics that the patient likes to take long showers but tonight she did not want to take a shower.  Her daughter was concerned about this and felt like she was not acting like herself so she called EMS.  The patient does not know where she is or why.  She is awake and alert.  She said she is "miserable" but when asked why, she says it is because she is cold.  I brought her a second warm blanket and she said that she feels much better and is happy now.  She denies pain and she denies shortness of breath.  She is not able to provide any additional history.  She has no complaints other than initially being cold and not wanting to be here.         Past Medical History:  Diagnosis Date  . Hypertension     Patient Active Problem List   Diagnosis Date Noted  . Atherosclerosis of artery of extremity with rest pain (HCC) 10/14/2015  . 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 ABDOMINAL HYSTERECTOMY    . PERIPHERAL VASCULAR CATHETERIZATION N/A 10/13/2015   Procedure: Abdominal Aortogram w/Lower Extremity;  Surgeon: Renford DillsGregory G Schnier, MD;  Location: [facility] INVASIVE CV LAB;  Service: Cardiovascular;  Laterality: N/A;  . PERIPHERAL VASCULAR CATHETERIZATION  10/13/2015   Procedure: Lower  Extremity Intervention;  Surgeon: Renford DillsGregory G Schnier, MD;  Location: [facility] INVASIVE CV LAB;  Service: Cardiovascular;;    Prior to Admission medications   Medication Sig Start Date End Date Taking? Authorizing Provider  acetaminophen (TYLENOL) 325 MG tablet Take 1-2 tablets (325-650 mg total) by mouth every 4 (four) hours as needed for mild pain (or temp >/= 101 F). Patient not taking: Reported on 05/10/2020 10/14/15   Stegmayer, Ranae PlumberKimberly A, PA-C  ASPIRIN 81 81 MG EC tablet Take 81 mg by mouth daily. 03/09/20   [provider]  brimonidine (ALPHAGAN) 0.15 % ophthalmic solution Place 1 drop into both eyes 2 (two) times daily.    [provider]  dorzolamide (TRUSOPT) 2 % ophthalmic solution Place 1 drop into both eyes 2 (two) times daily.    [provider]  irbesartan-hydrochlorothiazide (AVALIDE) 150-12.5 MG per tablet Take 1 tablet by mouth daily.    [provider]  Multiple Vitamins-Minerals (MULTIVITAMIN WITH MINERALS) tablet Take 1 tablet by mouth daily. Patient not taking: Reported on 05/10/2020    [provider]  vitamin B-12 (CYANOCOBALAMIN) 1000 MCG tablet Take 1,000 mcg by mouth daily. 05/07/20   [provider]    Allergies Patient has no known allergies.  Family History  Problem Relation Age of Onset  . Breast cancer Neg Hx     Social History Social History   Tobacco Use  . Smoking status: Never Smoker  . Smokeless tobacco: Never  Used  Substance Use Topics  . Alcohol use: No  . Drug use: No    Review of Systems Level 5 caveat:  history/ROS limited by chronic dementia   ____________________________________________   PHYSICAL EXAM:  VITAL SIGNS: ED Triage Vitals  Enc Vitals Group     BP 10/17/20 2343 (!) 157/63     Pulse Rate 10/17/20 2343 60     Resp 10/17/20 2343 13     Temp 10/17/20 2343 97.7 F (36.5 C)     Temp Source 10/17/20 2343 Oral     SpO2 10/17/20 2343 100 %     Weight 10/17/20 2346 47 kg (103  lb 9.9 oz)     Height 10/17/20 2346 1.651 m (5\' 5" )     Head Circumference --      Peak Flow --      Pain Score --      Pain Loc --      Pain Edu? --      Excl. in GC? --     Constitutional: Awake and alert, oriented to self only. Eyes: Conjunctivae are normal.  Head: Atraumatic. Nose: No congestion/rhinnorhea. Mouth/Throat: Patient is wearing a mask. Neck: No stridor.  No meningeal signs.   Cardiovascular: Normal rate, regular rhythm. Good peripheral circulation. Respiratory: Normal respiratory effort.  No retractions. Gastrointestinal: Soft and nontender. No distention.  Musculoskeletal: No lower extremity tenderness nor edema. No gross deformities of extremities. Neurologic:  Normal speech and language. No gross focal neurologic deficits are appreciated.  Good grip strength bilaterally, able to resist gravity and hold all 4 extremities up off the bed for 10 seconds.  No aphasia, no dysarthria, no facial droop. Skin:  Skin is warm, dry and intact.   ____________________________________________   LABS (all labs ordered are listed, but only abnormal results are displayed)  Labs Reviewed  COMPREHENSIVE METABOLIC PANEL - Abnormal; Notable for the following components:      Result Value   Glucose, Bld 123 (*)    BUN 33 (*)    Creatinine, Ser 1.20 (*)    GFR, Estimated 43 (*)    All other components within normal limits  CBG MONITORING, ED - Abnormal; Notable for the following components:   Glucose-Capillary 102 (*)    All other components within normal limits  CBC  URINALYSIS, ROUTINE W REFLEX MICROSCOPIC  CBG MONITORING, ED   ____________________________________________  EKG  ED ECG REPORT I, , the attending physician, personally viewed and interpreted this ECG.  Date: 10/17/2020 EKG Time: 23: 44 Rate: 61 Rhythm: normal sinus rhythm QRS Axis: Normal Intervals: LVH, prolonged QTC at 645 ms ST/T Wave abnormalities: Non-specific ST segment / T-wave  changes, but no clear evidence of acute ischemia. Narrative Interpretation: no definitive evidence of acute ischemia; does not meet STEMI criteria.   ____________________________________________  RADIOLOGY I, 10/19/2020, personally viewed and evaluated these images (plain radiographs) as part of my medical decision making, as well as reviewing the written report by the radiologist.  ED MD interpretation: Chronic changes, no acute abnormalities or emergent medical conditions identified.  Official radiology report(s): CT Head Wo Contrast  Result Date: 10/18/2020 CLINICAL DATA:  Altered mental status, history of dementia EXAM: CT HEAD WITHOUT CONTRAST TECHNIQUE: Contiguous axial images were obtained from the base of the skull through the vertex without intravenous contrast. COMPARISON:  CT and MRI 05/10/2020 FINDINGS: Brain: Hypoattenuation in the left parietal corona radiata likely corresponds to a region of previously seen infarct on comparison MR imaging. Additional  lacunar type infarcts present in the bilateral basal ganglia. Background of diffuse patchy and confluent regions of white matter hypoattenuation suggesting microvascular ischemic changes. Diffuse prominence of the ventricles and CSF containing spaces compatible with generalized parenchymal atrophy most pronounced in the mesial temporal lobes. No evidence of acute infarction, hemorrhage, extra-axial collection, visible mass lesion or mass effect. Vascular: Atherosclerotic calcification of the carotid siphons. No hyperdense vessel. Skull: No calvarial fracture or suspicious osseous lesion. No scalp swelling or hematoma. Sinuses/Orbits: Paranasal sinuses and mastoid air cells are predominantly clear. Bilateral lens extractions. Postsurgical changes of the left from prior left glaucoma drainage implant placement. Other: None. IMPRESSION: 1. No acute intracranial abnormality. 2. Hypoattenuation in the left parietal corona radiata likely  corresponds to a region of previously seen infarct on comparison MR imaging. Additional remote appearing lacunar type infarcts in the bilateral basal ganglia. 3. Background of diffuse patchy and confluent regions of white matter hypoattenuation suggesting microvascular ischemic changes. 4. Moderate generalized parenchymal atrophy most pronounced in the mesial temporal lobes. Electronically Signed   By: Kreg Shropshire M.D.   On: 10/18/2020 03:15    ____________________________________________   PROCEDURES   Procedure(s) performed (including Critical Care):  Procedures   ____________________________________________   INITIAL IMPRESSION / MDM / ASSESSMENT AND PLAN / ED COURSE  As part of my medical decision making, I reviewed the following data within the electronic MEDICAL RECORD NUMBER History obtained from family, Nursing notes reviewed and incorporated, Labs reviewed , EKG interpreted , Old chart reviewed and Notes from prior ED visits   Differential diagnosis includes, but is not limited to, dementia, delirium and for nonspecific reason, urinary tract infection, electrolyte or metabolic abnormality, less likely CVA.  Patient's vital signs are reassuring other than some mild hypertension.  She has no focal neurological deficits.  She has no complaints other than being cold which was satisfied with a warm blanket.  She has no pain and no respiratory difficulties.  Lungs are clear, no abdominal tenderness.  Basic labs ordered.  EKG is nonischemic.  No clear indication of an emergent medical issue at this time.  Daughter is reportedly on her way to the emergency department and I will attempt to get more information about any specific concerns she had.       Clinical Course as of 10/18/20 0423  Wynelle Link Oct 18, 2020  0032 Comprehensive metabolic panel notable for very slight increase in BUN and creatinine of 33 and 1.2 respectively which may represent a bit of volume depletion.  CBC is normal.   Blood glucose is assuring at 123. [CF]  0140 Discussed at bedside with daughter.  The daughter gave a lot more information about what happened earlier.  The patient had a decreased level of responsiveness while she was in the shower and was unable to talk with her daughter afterwards.  She started to act more her normal self by the time the paramedics arrived but was still confused and having trouble speaking when the first responders were there.  Her daughter states she is much more at her baseline currently; she does not know who her daughter is, but her daughter says that this happens from time to time as result of the dementia.  Her daughter understands that we will likely not be able to identify the exact cause of the episode.  We talked about CT versus MRI and how the CT is not nearly as sensitive but it is very unlikely the patient will be able to remain still for  an MRI, and she agreed with our current plan of CT head without contrast to rule out any obvious acute or emergent issues and a small fluid bolus.  She is comfortable taking the patient back as long as nothing emergent is identified.  [CF]  [id number] Patient has been stable now for more than 4 hours.  Head CT showed no acute abnormalities, sequela of prior CVA and atrophy consistent with her dementia.  I talked with her daughter and she is comfortable taking her home.  She understands that we cannot definitively rule out CVA/TIA but also agrees that there is little point in obtaining an MRI given that the patient will likely be unable to tolerate the imaging and it would likely not change management either way.  She also is comfortable not obtaining a urinalysis because she does not want the patient to undergo in and out catheterization and she has not had any urinary symptoms recently.  I gave my usual customary return and follow-up recommendations and she will follow up with her primary care doctor. [CF]    Clinical Course User Index [CF]  Loleta Rose, MD     ____________________________________________  FINAL CLINICAL IMPRESSION(S) / ED DIAGNOSES  Final diagnoses:  Transient alteration of awareness     MEDICATIONS GIVEN DURING THIS VISIT:  Medications  sodium chloride 0.9 % bolus 500 mL (0 mLs Intravenous Stopped 10/18/20 0401)     ED Discharge Orders    None      *Please note:  Erin Greer was evaluated in Emergency Department on 10/18/2020 for the symptoms described in the history of present illness. She was evaluated in the context of the global COVID-19 pandemic, which necessitated consideration that the patient might be at risk for infection with the SARS-CoV-2 virus that causes COVID-19. Institutional protocols and algorithms that pertain to the evaluation of patients at risk for COVID-19 are in a state of rapid change based on information released by regulatory bodies including the CDC and federal and state organizations. These policies and algorithms were followed during the patient's care in the ED.  Some ED evaluations and interventions may be delayed as a result of limited staffing during and after the pandemic.*  Note:  This document was prepared using Dragon voice recognition software and may include unintentional dictation errors.   Loleta Rose, MD 10/18/20 [phone]

## 2020-10-18 NOTE — ED Notes (Signed)
Pt verbally signed dishcharge paper due to malfunction to computer

## 2020-10-18 NOTE — ED Notes (Signed)
This nurse has made several attempts to obtain a urine sample and was unsuccessful due to pt being confused due to dementia and not allowing staff to get sample

## 2021-12-02 ENCOUNTER — Telehealth: Payer: Self-pay | Admitting: Primary Care

## 2021-12-02 NOTE — Telephone Encounter (Signed)
Spoke with patient's daughter Cyndal Kasson, regarding the referral/services and all questions were answered and she was in agreement with beginning services.  I have scheduled an In-home Consult for 12/07/21 @ 1:30 PM ?

## 2021-12-07 ENCOUNTER — Other Ambulatory Visit: Payer: Medicare Other | Admitting: Primary Care

## 2021-12-07 ENCOUNTER — Other Ambulatory Visit: Payer: Medicare Other

## 2021-12-07 DIAGNOSIS — Z7409 Other reduced mobility: Secondary | ICD-10-CM

## 2021-12-07 DIAGNOSIS — R5381 Other malaise: Secondary | ICD-10-CM

## 2021-12-07 DIAGNOSIS — F03C0 Unspecified dementia, severe, without behavioral disturbance, psychotic disturbance, mood disturbance, and anxiety: Secondary | ICD-10-CM

## 2021-12-07 DIAGNOSIS — Z638 Other specified problems related to primary support group: Secondary | ICD-10-CM

## 2021-12-07 DIAGNOSIS — Z9181 History of falling: Secondary | ICD-10-CM

## 2021-12-07 DIAGNOSIS — R413 Other amnesia: Secondary | ICD-10-CM

## 2021-12-07 DIAGNOSIS — Z515 Encounter for palliative care: Secondary | ICD-10-CM

## 2021-12-07 DIAGNOSIS — R011 Cardiac murmur, unspecified: Secondary | ICD-10-CM | POA: Insufficient documentation

## 2021-12-07 NOTE — Progress Notes (Addendum)
 Therapist, nutritional Palliative Care Consult Note Telephone: [phone]  Fax: [phone]   Date of encounter: 12/07/21 2:32 PM PATIENT NAME: Erin Greer [address] [place] [address]   [phone] (home)  DOB: 07-25-1928 MRN: [id number] PRIMARY CARE PROVIDER:    Jyl Railing, MD,  [address] [place] [address] [phone]  REFERRING PROVIDER:   Jyl Railing, MD [address] [place],  [place] [address] [phone]  RESPONSIBLE PARTY:    Contact Information     Name Relation Home Work Erin Greer Daughter [phone]     Erin Greer Daughter   [phone]       I met face to face with patient and family in  home. Palliative Care was asked to follow this patient by consultation request of  Erin Railing, MD to address advance care planning and complex medical decision making. This is the initial visit.                                    ASSESSMENT AND PLAN / RECOMMENDATIONS:   Advance Care Planning/Goals of Care: Goals include to maximize quality of life and symptom management. Patient/health care surrogate gave his/her permission to discuss.Our advance care planning conversation included a discussion about:     The value and importance of advance care planning  Experiences with loved ones who have been seriously ill or have died  Exploration of personal, cultural or spiritual beliefs that might influence medical decisions  Exploration of goals of care in the event of a sudden injury or illness  Identification of a healthcare agent - has husband but children are serving as POA Discussed getting guardianship for pt, and other legal needs, referred to elder care lawyer. Reviewed needs for in home care and discussed options. Review of an  advance directive document, MOST form reviewed, 5 Wishes given Discussed in home care options, has VA benefits from spouse, needs to locate discharge  papers to proceed. Husband may have prior d/c papers.  CODE STATUS: FULL   I spent 25 minutes providing this consultation. More than 50% of the time in this consultation was spent in counseling and care coordination. -------------------------------------------------------------------------------------------------------------------- Symptom Management/Plan:  Documentation for hospital bed with rails. Family requests full electric due to health of care givers ( cannot lift).   R 53. 81 Debility Z74.09 immobility Fall risk Z 91.81 Dementia F03.C  Mobility: Does not get up from bed or chair on cue, occasionally gets up on her own. She is a fall risk, and has gotten oob some at night and fallen getting back to bed. She needs full electric hospital bed to change positions and transfer safely. 1 of 2 caregivers cannot bendo down to crank semi electric bed. She has occasional tremors as well which impact her ability to self feed. Mentation is such that she is getting up from her high bed and is at great risk for falls.  Dementia: Advanced dementia, FAST score 7 a-b. Family is reporting she is listing in sitting now, an advancement of this disease. Speaks with family some, but does not participate in our interview. No official diagnosis but advanced dementia unspecified Dx today.  Nutrition: Feeding self, weight is 83 lbs. This is 20 lb loss in 1 year, 20%.  Pain: Family reports they think she does not have pain, and does have tylenol  if needed.   Caregiver  strain: Greer visit made with SW, family outlines strain of caring for 2 elderly parents. Discussed community resources, Erin Greer benefits, live in care givers, and ongoing palliative consultation.Other resources such as Erin Greer, Erin Greer eldercare discussed.  Follow up Palliative Care Visit: Palliative care will continue to follow for complex medical decision making, advance care planning, and clarification of goals. Return 4 weeks or prn.  This visit  was coded based on medical decision making (MDM).  PPS: 40%  HOSPICE ELIGIBILITY/DIAGNOSIS: yes/abnormal weight loss with concordant goals of care.  Chief Complaint:immobility, fall risk, advanced dementia  HISTORY OF PRESENT ILLNESS:  Erin Greer is a 88 y.o. year old female  with advanced dementia, immobility and fall risk, risk for skin break down, caregiver strain . Patient seen today to review palliative care needs to include medical decision making and advance care planning as appropriate.   History obtained from review of EMR, discussion with primary team, and interview with family, facility staff/caregiver and/or Erin Greer.  I reviewed available labs, medications, imaging, studies and related documents from the EMR.  Records reviewed and summarized above.   ROS  General: NAD ENMT: denies dysphagia Pulmonary: denies cough, denies increased SOB Abdomen: endorses good appetite, denies constipation, endorses incontinence of bowel GU: denies dysuria, endorses incontinence of urine MSK:  denies increased weakness,  no falls reported Skin: denies rashes , family reports sacral wounds on and off, currently healed stage 2  Neurological: denies pain, PAIN AD 0/10, denies insomnia Psych: Endorses flat  mood, not combative Heme/lymph/immuno: denies bruises, abnormal bleeding  Physical Exam: Current and past weights: 103 lbs in 2022, 83 lbs in 5/23. 20% loss Constitutional: NAD General: frail appearing, thin EYES: anicteric sclera, lids intact, no discharge  ENMT: intact hearing, oral mucous membranes moist, dentition intact CV: S1S2, RRR,  + murmur, no LE edema Pulmonary: LCTA, no increased work of breathing, no cough, room air Abdomen: intake 75%, no ascites GU: deferred MSK: +sarcopenia, moves all extremities, ambulatory with assistance Skin: warm and dry, no rashes or wounds on visible skin, At risk for increased breakdown Braden 13, moderate risk Neuro:  +  generalized weakness,  severe  cognitive impairment Psych: non-anxious affect, A and O x 1  Hem/lymph/immuno: no widespread bruising CURRENT PROBLEM LIST:  Patient Active Problem List   Diagnosis Date Noted   Atherosclerosis of artery of extremity with rest pain (HCC) 10/14/2015   Atherosclerosis of native arteries of extremities with rest pain, left leg (HCC) 10/13/2015   PAST MEDICAL HISTORY:  Active Ambulatory Problems    Diagnosis Date Noted   Atherosclerosis of native arteries of extremities with rest pain, left leg (HCC) 10/13/2015   Atherosclerosis of artery of extremity with rest pain (HCC) 10/14/2015   Resolved Ambulatory Problems    Diagnosis Date Noted   No Resolved Ambulatory Problems   Past Medical History:  Diagnosis Date   Hypertension    SOCIAL HX:  Social History   Tobacco Use   Smoking status: Never   Smokeless tobacco: Never  Substance Use Topics   Alcohol use: No   FAMILY HX:  Family History  Problem Relation Age of Onset   Breast cancer Neg Hx       ALLERGIES: No Known Allergies   PERTINENT MEDICATIONS:  Outpatient Encounter Medications as of 12/07/2021  Medication Sig   amLODipine  (NORVASC ) 5 MG tablet Take 5 mg by mouth daily.   ASPIRIN  81 81 MG EC tablet Take 81 mg by mouth daily.   brimonidine  (  ALPHAGAN ) 0.2 % ophthalmic solution Place 1 drop into both eyes 2 (two) times daily.   clopidogrel  (PLAVIX ) 75 MG tablet Take 75 mg by mouth daily.   dorzolamide  (TRUSOPT ) 2 % ophthalmic solution Place 1 drop into both eyes 2 (two) times daily.   hydrochlorothiazide  (HYDRODIURIL ) 25 MG tablet Take 25 mg by mouth daily.   olmesartan (BENICAR) 40 MG tablet Take 40 mg by mouth daily.   vitamin B-12 (CYANOCOBALAMIN ) 1000 MCG tablet Take 1,000 mcg by mouth daily.   acetaminophen  (TYLENOL ) 325 MG tablet Take 1-2 tablets (325-650 mg total) by mouth every 4 (four) hours as needed for mild pain (or temp >/= 101 F). (Patient not taking: Reported on 05/10/2020)    Multiple Vitamins-Minerals (MULTIVITAMIN WITH MINERALS) tablet Take 1 tablet by mouth daily. (Patient not taking: Reported on 05/10/2020)   [DISCONTINUED] irbesartan -hydrochlorothiazide  (AVALIDE) 150-12.5 MG per tablet Take 1 tablet by mouth daily.   No facility-administered encounter medications on file as of 12/07/2021.    Thank you for the opportunity to participate in the care of Erin Greer.  The palliative care team will continue to follow. Please call our office at [phone] if we can be of additional assistance.   Lamarr Welby Sharps, NP , DNP, AGPCNP-BC  COVID-19 PATIENT SCREENING TOOL Asked and negative response unless otherwise noted:  Have you had symptoms of covid, tested positive or been in contact with someone with symptoms/positive test in the past 5-10 days?

## 2021-12-07 NOTE — Progress Notes (Signed)
COMMUNITY PALLIATIVE CARE SW NOTE ? ?PATIENT NAME: Erin Greer ?DOB: 1928-01-05 ?MRN: [id number] ? ?PRIMARY CARE PROVIDER: Gayland Curry, MD ? ?RESPONSIBLE PARTY:  ?Acct ID - Guarantor Home Phone Work Phone Relationship Acct Type  ?[id number] KIRSTIN, KUGLER* [phone]  Self P/F  ?   [address], [place], [place] [address]  ? ? ? ?PLAN OF CARE and INTERVENTIONS:          ? ?GOALS OF CARE/ ADVANCE CARE PLANNING: Goals include to maximize quality of life and symptom management. Our advance care planning conversation included a discussion about:    ?The value and importance of advance care planning  ?Review and updating or creation of an advance directive document.  ? Patient is a FULL CODE. Blank MOST form and five wishes left in the home. ?  ?Palliative care encounter: Palliative medicine team will continue to support patient, patient's family, and medical team. Visit consisted of counseling and education dealing with the complex and emotionally intense issues of symptom management and palliative care in the setting of serious and potentially life-threatening illness. ? ?SW completed visit with PC NP - K. Tamala Julian, met with patient and patients family (spouse, daughters Michael Boston and Hassan Rowan and son Ulice Dash). Patient lives in a one story home with spouse. Patient has progressing dementia. ? ?PC NP discussed palliative care vs Hospice and PC role and function. ? ?Functional changes/updates: Patient is a 88 y.o female with Alzheimer's. Patient was sitting in living with lunch tray in front of her. Family denies recent falls. Patient is unsteady on her feet. Fall and safety precautions discussed. Patient does not use AD to assist with ambulation.  Patient is total care with all ADL's. Daughters, Dalene Seltzer and Neoma Laming, provide 24/7 care by swapping shifts per month. Daughers have expressed caregiver fatigue. ? ?Home & Environment assessment: No concerns. Patient feels safe in her home environment. Patient's  home is one level. Patient is able to maneuver around her home without issue. Family expressed interest/need for hospital bed.  ? ?Protein calorie malnutrition/weight loss/Appetite: Patient's appetite is fair. Patient has had a 7lb weight loss within past few years. Fluid intake is poor. Patient has top partials.  ? ?Psychosocial assessment: completed.  ? ?PC discussed in home support due to family/daughters expressing caregiver fatigue. Family feels that patient will not qualify for Medicaid. Patient does not have an active long term care policy. The following home care agencies were provided to daughters: [facility] ([facility]) [phone], [facility] ([facility]) [phone], and [facility] (live-in caregiver) [phone]. ? ?Family shared their need/concern is assistance with patient taking her medication, and if PC would be visiting daily to assist. PC NP addressed this need and advised that PC does not make daily visits and discussed different alternatives/ways patient can take medication. ? ?Ongoing support/resources will continued to be offered if needed.  ? ?Military hx: Spouse [employer] [phone] Inda Castle). Patients daughter is working with spouse to apply for VA benefits. Aid & attendance discussed. ? ?Medical assessment: PC NP reviewed medications and took vitals.  ? ?Hospice discussion: N/A ? ?SW discussed goals, reviewed care plan, provided emotional support, used active and reflective listening in the form of reciprocity emotional response. Family wishes to keep patient in the home with spouse for as long as possible. Questions and concerns were addressed. The patient/family was encouraged to call with any additional questions and/or concerns. PC Provided general support and encouragement, no other unmet needs identified. Will  continue to follow. ? ?3.         PATIENT/CAREGIVER EDUCATION/ COPING:   ?Appearance: well groomed, appropriate given situation  ?Mental  Status: alert and oriented to self only ?Eye Contact: poor  ?Thought Process: irrational  ?Thought Content: not assessed  ?Speech: Normal rate, volume, tone  ?Mood: Normal and calm ?Affect: Congruent to endorsed mood, full ranging ?Insight: poor ?Judgement: poor ?Interaction Style: Cooperative ?Patient A&O to self only, patient did not and does not engage in fluent conversation. Patient was sleeping or sitting with eyes closed and not engaged during visit. PHQ 9 not assessed. Patients family is supportive. ? ?4.         PERSONAL EMERGENCY PLAN:  Patient family will call 9-1-1 for emergencies.   ? ?5.         COMMUNITY RESOURCES COORDINATION/ HEALTH CARE NAVIGATION:  Patient and children manages his care. ? ?6.          FINANCIAL CONCERNS/NEEDS: None. ?                        Primary Health Insurance: Medicare ?Secondary Health Insurance: Fate ?Prescription Coverage: Yes, no history of difficulty obtaining or affording prescriptions reported ?   ? ?SOCIAL HX:  ?Social History  ? ?Tobacco Use  ? Smoking status: Never  ? Smokeless tobacco: Never  ?Substance Use Topics  ? Alcohol use: No  ? ? ?CODE STATUS: FULL CODE ?ADVANCED DIRECTIVES: N ?MOST FORM COMPLETE:  ongoing discussion ?HOSPICE EDUCATION PROVIDED: Y ? ?PPS: Patient is total care with ADL'S ? ? ? ? ? ?Doreene Eland, LCSW ? ?

## 2022-01-05 ENCOUNTER — Other Ambulatory Visit: Payer: Medicare Other | Admitting: Primary Care

## 2022-01-05 DIAGNOSIS — R5381 Other malaise: Secondary | ICD-10-CM

## 2022-01-05 DIAGNOSIS — R413 Other amnesia: Secondary | ICD-10-CM

## 2022-01-05 DIAGNOSIS — Z7409 Other reduced mobility: Secondary | ICD-10-CM

## 2022-01-05 DIAGNOSIS — F03C0 Unspecified dementia, severe, without behavioral disturbance, psychotic disturbance, mood disturbance, and anxiety: Secondary | ICD-10-CM

## 2022-01-05 DIAGNOSIS — Z515 Encounter for palliative care: Secondary | ICD-10-CM

## 2022-01-05 NOTE — Progress Notes (Signed)
    [facility] Consult Note Telephone: [phone]  Fax: [phone]    Date of encounter: 01/05/22 PATIENT NAME: Erin Greer [employer] [id number]   [phone] (home)  DOB: 10-Jun-1928 MRN: [id number] PRIMARY CARE PROVIDER:    Gayland Curry, MD,  [place] [address] [id number]  REFERRING PROVIDER:   Gayland Curry, MD [address] [place],  [place] [address] [phone]  RESPONSIBLE PARTY:    Contact Information     Name Relation Home Work [place] Daughter [phone]     Carolee Rota Daughter   [phone]        I met face to face with patient and family in  home. Palliative Care was asked to follow this patient by consultation request of  Gayland Curry, MD to address advance care planning.                                   ASSESSMENT AND PLAN / RECOMMENDATIONS:   Advance Care Planning/Goals of Care: Goals include to maximize quality of life and symptom management. Patient/health care surrogate gave his/her permission to discuss.Our advance care planning conversation included a discussion about:    The value and importance of advance care planning  Experiences with loved ones who have been seriously ill or have died  Exploration of personal, cultural or spiritual beliefs that might influence medical decisions  Exploration of goals of care in the event of a sudden injury or illness  Identification of a healthcare agent - none, guardianship discussed Review of an  advance directive document - MOST form given CODE STATUS: Full Have not completed MOST yet as they are waiting for a sister from [place] to arrive. There is also another brother and another sister in the area. Patient and husband have no POA in place, discussed the children proceeding for guardianship. Discussed patient advance care plans with 2 sisters present at previous meeting,  and another sister who is here from [place].  Discussed care needs of patient as well as her husband/their father. He has dementia as well but not as much debility Patient at FAST stage 7 C. Can feed self but takes several hours Intake 50%, PPS 40% Discussed patient frailty and concept of futile care, what might be appropriate at a younger age might not benefit her at her level of frailty. Discussed available care options including home health, hospice, and palliative medicine.  Follow up Palliative Care Visit: Palliative care will continue to follow for complex medical decision making, advance care planning, and clarification of goals. Return 12 weeks or prn.  I spent 50 minutes providing this consultation. More than 50% of the time in this consultation was spent in counseling and care coordination.  Thank you for the opportunity to participate in the care of Erin Greer.  The palliative care team will continue to follow. Please call our office at [phone] if we can be of additional assistance.   Jason Coop DNP, MPH, AGPCNP-BC, ACHPN   COVID-19 PATIENT SCREENING TOOL Asked and negative response unless otherwise noted:   Have you had symptoms of covid, tested positive or been in contact with someone with symptoms/positive test in the past 5-10 days?

## 2022-01-23 IMAGING — CT CT HEAD W/O CM
4 series · 16 of 47 positions shown, 18 images · non-contrast
Comparison: CT and MRI 05/10/2020

CLINICAL DATA: Altered mental status, history of dementia

EXAM:
CT HEAD WITHOUT CONTRAST
TECHNIQUE: Contiguous axial images were obtained from the base of the skull
through the vertex without intravenous contrast.

[Series 2: head bone · axial · 0.41mm/px · z∈[-148,-120]mm · 3 of 74 slices shown]
[im 8/74  bone]
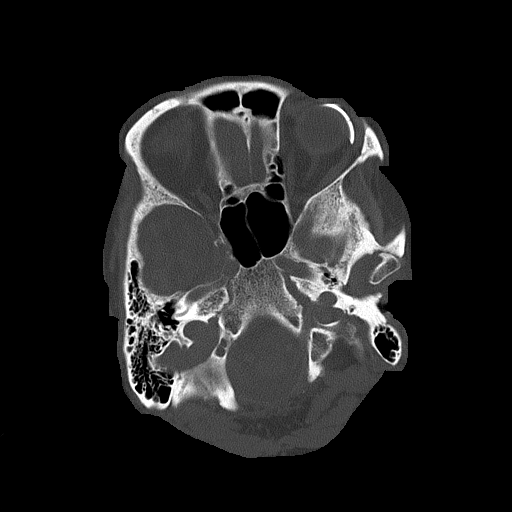
[im 15/74  bone]
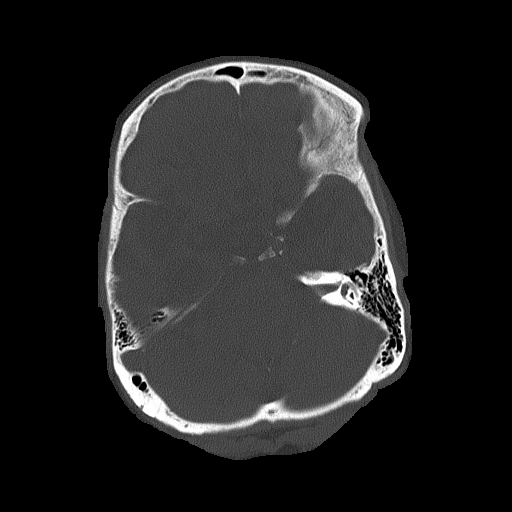
[im 22/74  bone]
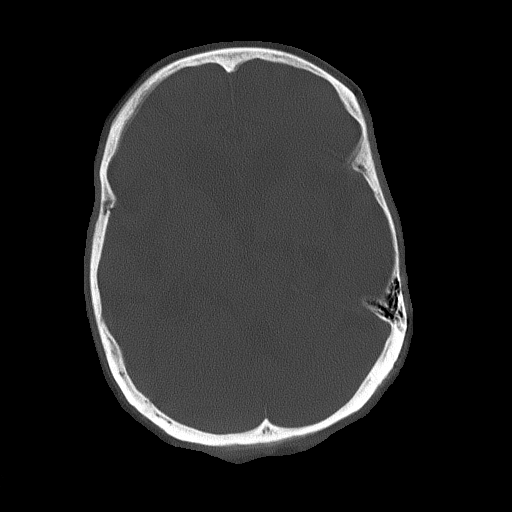

[Series 3: head wo · axial · 0.41mm/px · z∈[-147,-32]mm · 7 of 31 slices shown, 9 images]
[im 4/31  brain]
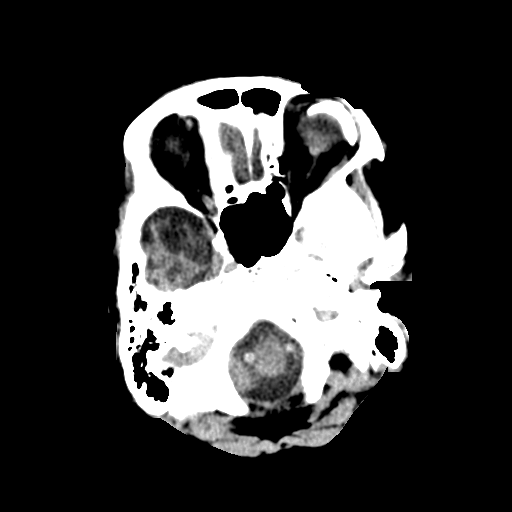
[im 4/31  bone]
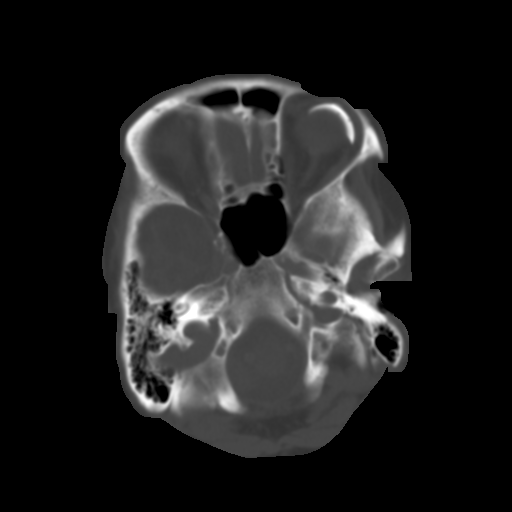
[im 8/31  brain]
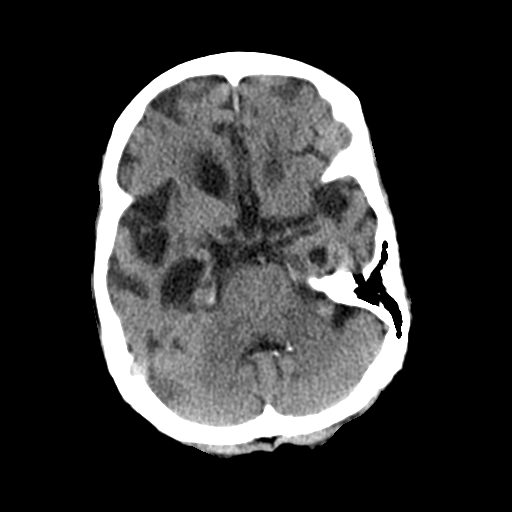
[im 12/31  brain]
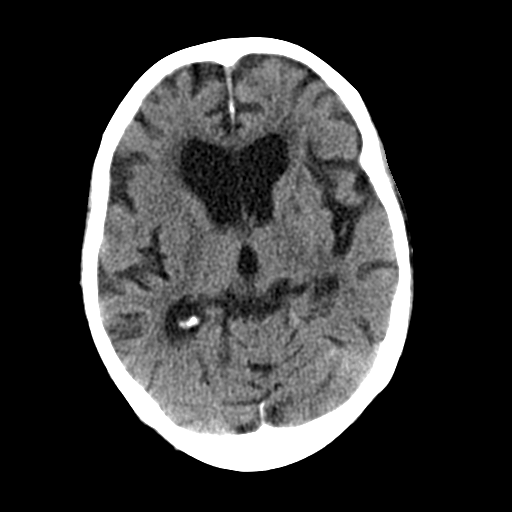
[im 16/31  brain]
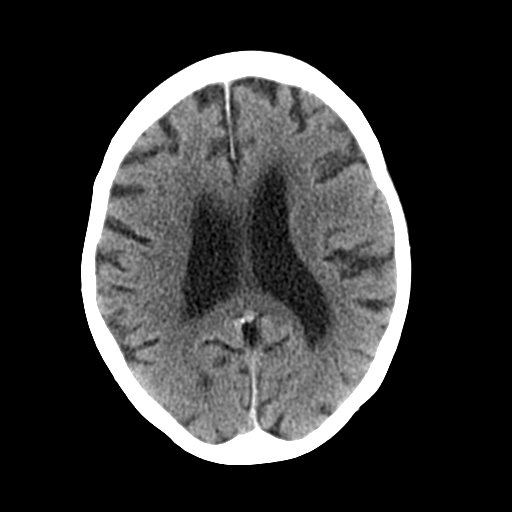
[im 19/31  brain]
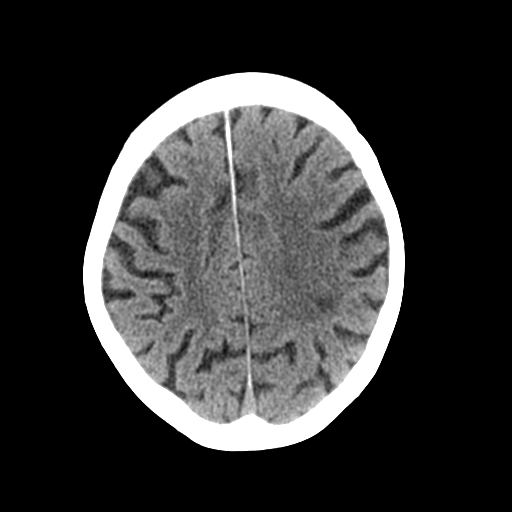
[im 19/31  bone]
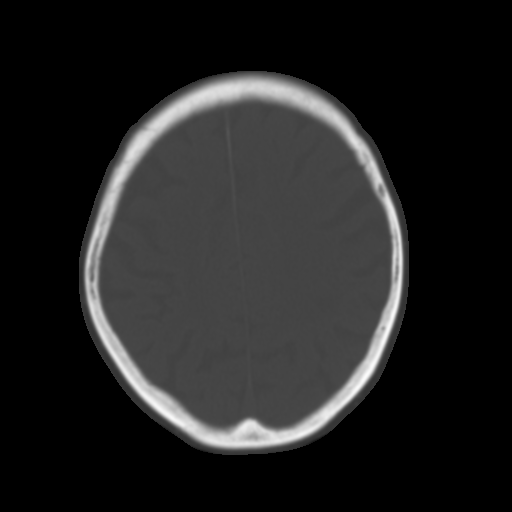
[im 23/31  brain]
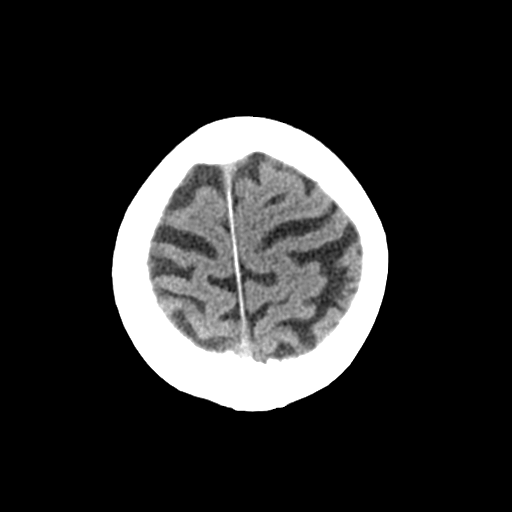
[im 27/31  brain]
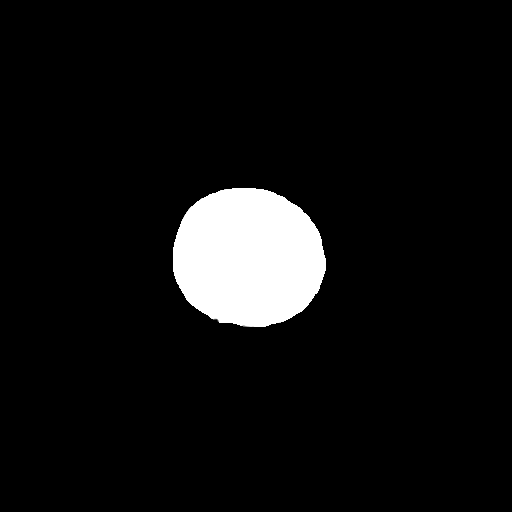

[Series 4: coronal soft tissue · coronal · 0.31mm/px · 3 of 67 slices shown]
[im 23/67  brain]
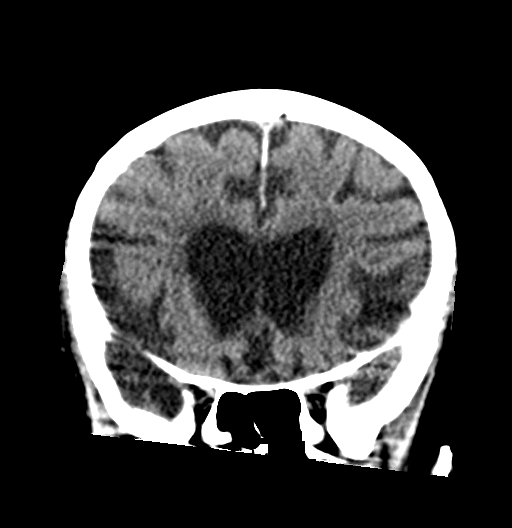
[im 30/67  brain]
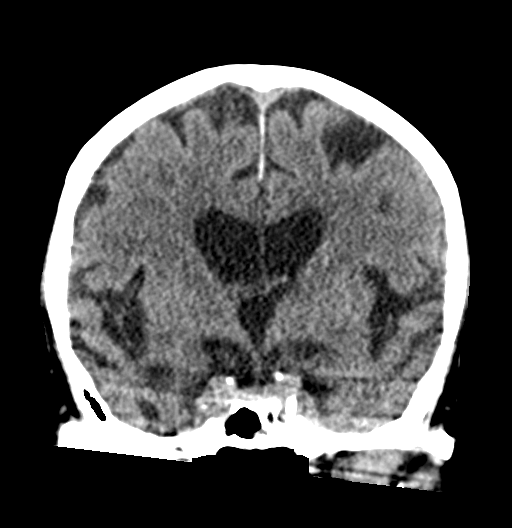
[im 37/67  brain]
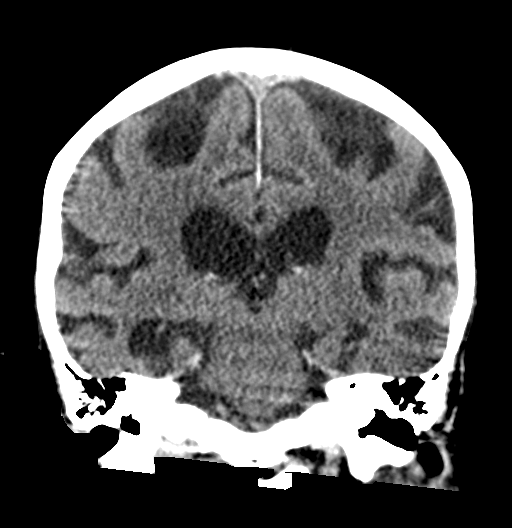

[Series 5: sagittal soft tissue · sagittal · 0.31mm/px · 3 of 55 slices shown]
[im 19/55  brain]
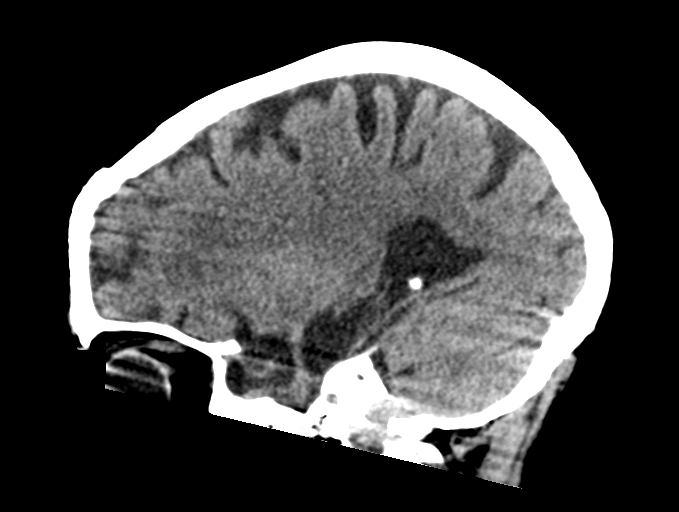
[im 28/55  brain]
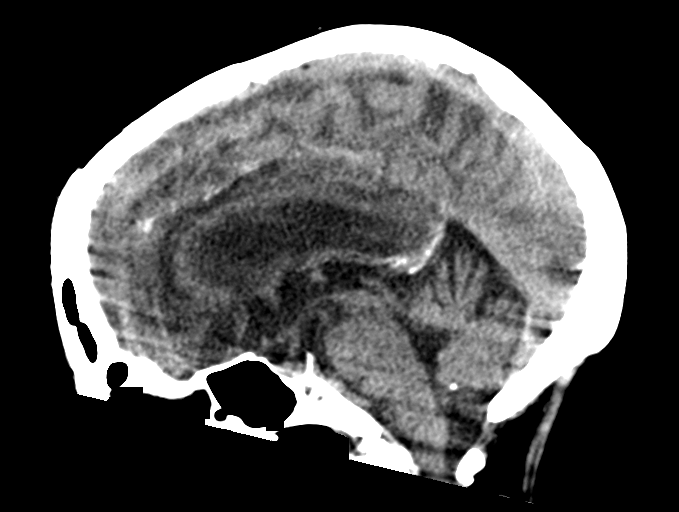
[im 37/55  brain]
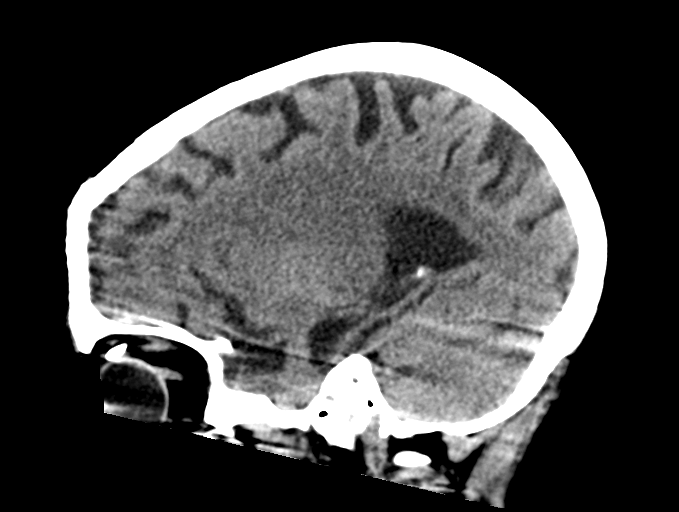

[16 of 47 positions shown; findings below may reference images not displayed]

FINDINGS: Brain: Hypoattenuation in the left parietal corona radiata likely
corresponds to a region of previously seen infarct on comparison MR
imaging. Additional lacunar type infarcts present in the bilateral
basal ganglia. Background of diffuse patchy and confluent regions of
white matter hypoattenuation suggesting microvascular ischemic
changes. Diffuse prominence of the ventricles and CSF containing
spaces compatible with generalized parenchymal atrophy most
pronounced in the mesial temporal lobes. No evidence of acute
infarction, hemorrhage, extra-axial collection, visible mass lesion
or mass effect.

Vascular: Atherosclerotic calcification of the carotid siphons. No
hyperdense vessel.

Skull: No calvarial fracture or suspicious osseous lesion. No scalp
swelling or hematoma.

Sinuses/Orbits: Paranasal sinuses and mastoid air cells are
predominantly clear. Bilateral lens extractions. Postsurgical
changes of the left from prior left glaucoma drainage implant
placement.

Other: None.
IMPRESSION: 1. No acute intracranial abnormality.
2. Hypoattenuation in the left parietal corona radiata likely
corresponds to a region of previously seen infarct on comparison MR
imaging. Additional remote appearing lacunar type infarcts in the
bilateral basal ganglia.
3. Background of diffuse patchy and confluent regions of white
matter hypoattenuation suggesting microvascular ischemic changes.
4. Moderate generalized parenchymal atrophy most pronounced in the
mesial temporal lobes.

## 2022-01-25 ENCOUNTER — Telehealth: Payer: Self-pay

## 2022-01-25 NOTE — Telephone Encounter (Signed)
510 pm.  Message received from daughter Willaim Sheng requesting to cancel appointment.  Return call made and daughter advised she would like to discharge from services.  I advised if our services were needed in the future she could request a referral from her PCP.  No other concerns voiced.  Update provided to Clearnce Sorrel, NP.

## 2022-05-04 ENCOUNTER — Encounter: Payer: Self-pay | Admitting: Intensive Care

## 2022-05-04 ENCOUNTER — Emergency Department
Admission: EM | Admit: 2022-05-04 | Discharge: 2022-05-04 | Disposition: A | Discharge status: Home / Self Care | Payer: Medicare Other | Attending: Emergency Medicine | Admitting: Emergency Medicine

## 2022-05-04 ENCOUNTER — Emergency Department: Payer: Medicare Other

## 2022-05-04 ENCOUNTER — Other Ambulatory Visit: Payer: Self-pay

## 2022-05-04 DIAGNOSIS — I10 Essential (primary) hypertension: Secondary | ICD-10-CM | POA: Insufficient documentation

## 2022-05-04 DIAGNOSIS — F0390 Unspecified dementia without behavioral disturbance: Secondary | ICD-10-CM | POA: Insufficient documentation

## 2022-05-04 DIAGNOSIS — R251 Tremor, unspecified: Secondary | ICD-10-CM | POA: Diagnosis present

## 2022-05-04 HISTORY — DX: Unspecified dementia, unspecified severity, without behavioral disturbance, psychotic disturbance, mood disturbance, and anxiety: F03.90

## 2022-05-04 HISTORY — DX: Cerebral infarction, unspecified: I63.9

## 2022-05-04 HISTORY — DX: Pure hypercholesterolemia, unspecified: E78.00

## 2022-05-04 LAB — URINALYSIS, COMPLETE (UACMP) WITH MICROSCOPIC
Bacteria, UA: NONE SEEN
Bilirubin Urine: NEGATIVE
Glucose, UA: NEGATIVE mg/dL
Hgb urine dipstick: NEGATIVE
Ketones, ur: NEGATIVE mg/dL
Leukocytes,Ua: NEGATIVE
Nitrite: NEGATIVE
Protein, ur: NEGATIVE mg/dL
Specific Gravity, Urine: 1.018 (ref 1.005–1.030)
pH: 6 (ref 5.0–8.0)

## 2022-05-04 LAB — TSH: TSH: 2.648 u[IU]/mL (ref 0.350–4.500)

## 2022-05-04 LAB — CBC
HCT: 36.4 % (ref 36.0–46.0)
Hemoglobin: 11.4 g/dL — ABNORMAL LOW (ref 12.0–15.0)
MCH: 28.4 pg (ref 26.0–34.0)
MCHC: 31.3 g/dL (ref 30.0–36.0)
MCV: 90.8 fL (ref 80.0–100.0)
Platelets: 292 10*3/uL (ref 150–400)
RBC: 4.01 MIL/uL (ref 3.87–5.11)
RDW: 12.9 % (ref 11.5–15.5)
WBC: 5.3 10*3/uL (ref 4.0–10.5)
nRBC: 0 % (ref 0.0–0.2)

## 2022-05-04 LAB — BASIC METABOLIC PANEL
Anion gap: 7 (ref 5–15)
BUN: 26 mg/dL — ABNORMAL HIGH (ref 8–23)
CO2: 29 mmol/L (ref 22–32)
Calcium: 10.5 mg/dL — ABNORMAL HIGH (ref 8.9–10.3)
Chloride: 106 mmol/L (ref 98–111)
Creatinine, Ser: 0.9 mg/dL (ref 0.44–1.00)
GFR, Estimated: 60 mL/min — ABNORMAL LOW (ref >60)
Glucose, Bld: 115 mg/dL — ABNORMAL HIGH (ref 70–99)
Potassium: 4.7 mmol/L (ref 3.5–5.1)
Sodium: 142 mmol/L (ref 135–145)

## 2022-05-04 LAB — TROPONIN I (HIGH SENSITIVITY): Troponin I (High Sensitivity): 8 ng/L (ref <18)

## 2022-05-04 NOTE — ED Triage Notes (Signed)
First Nurse Note:  Pt via EMS from home. Family reports last night tremors. They called her hospice nurse and by the time the nurse got there the symptoms have subsided. Pt has a hx of stroke 2 years ago but no deficits. Pt is alert and NAD.   118 CBG  98.0  98% on RA 41 ETCO2 154/70 BP

## 2022-05-04 NOTE — ED Triage Notes (Signed)
Patient arrived by EMS from home with c/o tremors. They called hospice nurse yesterday evening and when she arrived the tremors had stopped. This AM tremors started again and twitching all over so the daughter called EMS. Daughter reports baseline communication is off. Not as talkative this AM as normal.   Hx stroke 2 years ago with no deficits.   Hx Dementia

## 2022-05-04 NOTE — Discharge Instructions (Signed)
Please follow-up with your doctor within the next 1 or 2 days for recheck/reevaluation.  Return to the emergency department for any symptom personally concerning to yourself or family member.

## 2022-05-04 NOTE — ED Provider Notes (Signed)
[facility] Provider Note    Event Date/Time   First MD Initiated Contact with Patient 05/04/22 1242     (approximate)  History   Chief Complaint: Tremors  HPI  Erin Greer is a 88 y.o. female with a past medical history of dementia, hypertension, hyperlipidemia, CVA, presents to the emergency department for tremors.  According to the daughter since last night the patient has had intermittent tremor which she describes as more of the shaking of both of her upper extremities.  Daughter states this resolved with the nurse came over this morning however started again afterwards so she brought the patient to the emergency department.  Daughter did not know if this was more of progression of her dementia or if this could be a sign of something else.  She states the patient does have a history of a stroke.  No infectious symptoms per daughter, no fever no cough.  Physical Exam   Triage Vital Signs: ED Triage Vitals  Enc Vitals Group     BP 05/04/22 1131 (!) 144/97     Pulse Rate 05/04/22 1131 82     Resp 05/04/22 1131 20     Temp 05/04/22 1131 98.3 F (36.8 C)     Temp Source 05/04/22 1131 Oral     SpO2 05/04/22 1131 98 %     Weight 05/04/22 1050 83 lb (37.6 kg)     Height 05/04/22 1050 5\' 1"  (1.549 m)     Head Circumference --      Peak Flow --      Pain Score --      Pain Loc --      Pain Edu? --      Excl. in [place]? --     Most recent vital signs: Vitals:   05/04/22 1345 05/04/22 1400  BP: (!) 156/96 (!) 171/68  Pulse: 70 66  Resp: 16 15  Temp:    SpO2:  97%    General: Awake, no distress.  CV:  Good peripheral perfusion.  Regular rate and rhythm  Resp:  Normal effort.  Equal breath sounds bilaterally.  Abd:  No distention.  Soft, nontender.  No rebound or guarding. Other:  Patient is not speaking but daughter states oftentimes the patient does not speak due to her dementia.   ED Results / Procedures / Treatments   EKG  EKG  viewed and interpreted by myself shows a normal sinus rhythm at 65 bpm with a narrow QRS, normal axis, normal intervals, nonspecific ST changes with some mild lateral T wave inversions.  No ST elevation.  RADIOLOGY  I reviewed and interpreted the CT head images.  Patient has very large ventricles but no obvious bleed on my evaluation. Radiology is read the CT scan is negative. Chest x-ray is clear.  MEDICATIONS ORDERED IN ED: Medications - No data to display   IMPRESSION / MDM / [place] / ED COURSE  I reviewed the triage vital signs and the nursing notes.  Patient's presentation is most consistent with acute presentation with potential threat to life or bodily function.  Patient presents emergency department for tremor/shaking intermittent since yesterday.  Daughter took a video of one of the episodes in which the patient seemed to be clenching her jaw at times but not in the seizure like manner.  Slight tremor both the upper extremities but the patient still making purposeful movements with both extremities.  Did not appear consistent with seizure activity.  We  will check labs obtain a CT scan of the head.  We will obtain a chest x-ray and urinalysis looking for any infectious cause however patient is afebrile in the emergency department.  Here the patient is awake alert acting normal per daughter.  She will occasionally speak but daughter states often times she does not speak due to dementia.  Lab work is overall reassuring with a normal BMP reassuring CBC negative troponin and normal TSH.  CT scan of the head and chest x-ray are reassuring as well.  Urinalysis pending  Urinalysis is normal.  Overall patient continues to appear well.  We will discharge patient home with PCP follow-up.  Daughter agreeable to plan of care.  FINAL CLINICAL IMPRESSION(S) / ED DIAGNOSES   Tremor    Note:  This document was prepared using Dragon voice recognition software and may include  unintentional dictation errors.   Minna Antis, MD 05/04/22 1515

## 2022-07-05 ENCOUNTER — Encounter (INDEPENDENT_AMBULATORY_CARE_PROVIDER_SITE_OTHER): Payer: Medicare Other | Admitting: Vascular Surgery

## 2022-09-06 ENCOUNTER — Ambulatory Visit (INDEPENDENT_AMBULATORY_CARE_PROVIDER_SITE_OTHER): Admitting: Vascular Surgery

## 2022-09-06 ENCOUNTER — Other Ambulatory Visit (INDEPENDENT_AMBULATORY_CARE_PROVIDER_SITE_OTHER): Payer: Self-pay | Admitting: Vascular Surgery

## 2022-09-06 ENCOUNTER — Encounter (INDEPENDENT_AMBULATORY_CARE_PROVIDER_SITE_OTHER): Payer: Self-pay | Admitting: Vascular Surgery

## 2022-09-06 ENCOUNTER — Ambulatory Visit (INDEPENDENT_AMBULATORY_CARE_PROVIDER_SITE_OTHER): Payer: Medicare Other

## 2022-09-06 VITALS — BP 123/66 | HR 73 | Resp 18

## 2022-09-06 DIAGNOSIS — I70262 Atherosclerosis of native arteries of extremities with gangrene, left leg: Secondary | ICD-10-CM | POA: Diagnosis not present

## 2022-09-06 DIAGNOSIS — F0390 Unspecified dementia without behavioral disturbance: Secondary | ICD-10-CM | POA: Insufficient documentation

## 2022-09-06 DIAGNOSIS — F03C0 Unspecified dementia, severe, without behavioral disturbance, psychotic disturbance, mood disturbance, and anxiety: Secondary | ICD-10-CM

## 2022-09-06 DIAGNOSIS — I96 Gangrene, not elsewhere classified: Secondary | ICD-10-CM

## 2022-09-06 DIAGNOSIS — I10 Essential (primary) hypertension: Secondary | ICD-10-CM | POA: Diagnosis not present

## 2022-09-06 DIAGNOSIS — I70269 Atherosclerosis of native arteries of extremities with gangrene, unspecified extremity: Secondary | ICD-10-CM | POA: Insufficient documentation

## 2022-09-06 NOTE — Patient Instructions (Signed)
Endovascular Therapy for Peripheral Vascular Disease, Care After The following information offers guidance on how to care for yourself after your procedure. Your health care provider may also give you more specific instructions. If you have problems or questions, contact your health care provider. What can I expect after the procedure? After the procedure, it is common to have: Pain. Soreness and bruising around your puncture or incision (access site). Fatigue. Follow these instructions at home: Access site care  Follow instructions from your health care provider about how to take care of your access site. Make sure you: Wash your hands with soap and water for at least 20 seconds before and after you change your bandage (dressing). If soap and water are not available, use hand sanitizer. Change your dressing as told by your health care provider. Leave stitches (sutures), skin glue, or adhesive strips in place. If adhesive strip edges start to loosen and curl up, you may trim the loose edges. Do not remove adhesive strips or skin glue completely unless your health care provider tells you to do that. Check your access site every day for signs of infection. Check for: More redness, swelling, or pain. A lump or bump. Fluid or blood. Warmth. Pus or a bad smell. Medicines Take over-the-counter and prescription medicines only as told by your health care provider. You may need to take medicines to prevent blood clots and to lower your cholesterol. If you were prescribed an antibiotic medicine, take it as told by your health care provider. Do not stop taking the antibiotic even if you start to feel better. Driving Do not drive until your health care provider approves. If you were given a sedative during the procedure, it can affect you for several hours. Do not drive or operate machinery until your health care provider says that it is safe. Ask your health care provider if the medicine prescribed to  you requires you to avoid driving or using machinery. Activity Rest as told by your health care provider. Avoid sitting for a long time without moving. Get up to take short walks every 1-2 hours. This is important to improve blood flow and breathing. Ask for help if you feel weak or unsteady. Do not lift anything that is heavier than 10 lb (4.5 kg), or the limit that you are told, until your health care provider says that it is safe. Avoid activity that requires a lot of effort, such as exercise and sports, as told by your health care provider. Avoid sexual activity until your health care provider says it is safe. Follow your exercise plan as told by your health care provider. Return to your normal activities as told by your health care provider. Ask your health care provider what activities are safe for you. Eating and drinking Drink fluids as instructed to help flush out the dye used during the procedure. Follow instructions from your health care provider about eating or drinking restrictions. You may need to eat a diet that is low in salt (sodium) and fat. Avoid drinking alcohol. General instructions Do not take baths, swim, or use a hot tub until your health care provider approves. Ask your health care provider if you may take showers. You may only be allowed to take sponge baths. Do not use any products that contain nicotine or tobacco. These products include cigarettes, chewing tobacco, and vaping devices, such as e-cigarettes. If you need help quitting, ask your health care provider. Keep all follow-up visits. This is important. Contact a health care  provider if: You have a fever. You have severe pain that does not get better with medicine. You have more redness, swelling, or pain around your access site. You have a lump or bump at your access site. Get help right away if:  You have fluid or blood coming from your access site. If this happens, lie down on your back and apply pressure  to the area. You have chest pain. You have problems breathing. You have pain, numbness, or tingling in your legs. You faint. You have any symptoms of a stroke. "BE FAST" is an easy way to remember the main warning signs of a stroke: B - Balance. Signs are dizziness, sudden trouble walking, or loss of balance. E - Eyes. Signs are trouble seeing or a sudden change in vision. F - Face. Signs are sudden weakness or numbness of the face, or the face or eyelid drooping on one side. A - Arms. Signs are weakness or numbness in an arm. This happens suddenly and usually on one side of the body. S - Speech. Signs are sudden trouble speaking, slurred speech, or trouble understanding what people say. T - Time. Time to call emergency services. Write down what time symptoms started. You have other signs of a stroke, such as: A sudden, severe headache with no known cause. Nausea or vomiting. Seizure. These symptoms may represent a serious problem that is an emergency. Do not wait to see if the symptoms will go away. Get medical help right away. Call your local emergency services (911 in the [place] Do not drive yourself to the hospital. Summary After the procedure, it is common to have pain and soreness near your puncture or incision (access site). Check your access site every day for signs of infection, such as redness, swelling, or pain. You may need to take medicines to prevent blood clots and to lower your cholesterol. If you have any signs of a stroke, get help right away. This information is not intended to replace advice given to you by your health care provider. Make sure you discuss any questions you have with your health care provider. Document Revised: 08/12/2021 Document Reviewed: 01/20/2020 Elsevier Patient Education  [employer].

## 2022-09-06 NOTE — Assessment & Plan Note (Signed)
Seems fairly profound and the patient is quite debilitated but this is clearly a critical and limb threatening situation with her foot.

## 2022-09-06 NOTE — Progress Notes (Signed)
  Patient ID: Erin Greer, female   DOB: 07/07/1928, 89 y.o.   MRN: [id number]  Chief Complaint  Patient presents with   New Patient (Initial Visit)    np. consult. left leg edema. rerferred by Erin Greer    HPI [name] Like is a 89 y.o. female.  I am asked to see the patient by Dr. Aldridge and Dr. Baker for evaluation of peripheral arterial disease with black discoloration of her left great toe as well as nonhealing a toenail removal of the left great toe.  Patient has fairly profound dementia at this point and her daughter provides the history.  About 7 years ago, she underwent extensive left lower extremity revascularization and I do not see that this has been checked in the last several years.  There was discoloration of the toe noted by her podiatrist and with her previous history, she is referred for further evaluation and treatment.  No fevers or chills.  Difficult to assess how much this hurts her.  To assess her lower extremity perfusion with a gangrenous changes to the left great toe, ABIs were done today.  The right ABI was 0.67 and the left ABI 0.63 with poor monophasic waveforms.  No digital pressure was detectable in either lower extremity.   Past Medical History:  Diagnosis Date   Dementia (HCC)    High cholesterol    Hypertension    Stroke (HCC)     Past Surgical History:  Procedure Laterality Date   ABDOMINAL HYSTERECTOMY     PERIPHERAL VASCULAR CATHETERIZATION N/A 10/13/2015   Procedure: Abdominal Aortogram w/Lower Extremity;  Surgeon: Gregory G Schnier, MD;  Location: [facility];  Service: Cardiovascular;  Laterality: N/A;   PERIPHERAL VASCULAR CATHETERIZATION  10/13/2015   Procedure: Lower Extremity Intervention;  Surgeon: Gregory G Schnier, MD;  Location: [facility];  Service: Cardiovascular;;     Family History  Problem Relation Age of Onset   Breast cancer Neg Hx   No bleeding or clotting disorders No  aneurysms   Social History   Tobacco Use   Smoking status: Former    Types: Cigarettes   Smokeless tobacco: Never  Vaping Use   Vaping Use: Never used  Substance Use Topics   Alcohol use: No   Drug use: No     No Known Allergies  Current Outpatient Medications  Medication Sig Dispense Refill   acetaminophen (TYLENOL) 325 MG tablet Take 1-2 tablets (325-650 mg total) by mouth every 4 (four) hours as needed for mild pain (or temp >/= 101 F).     amLODipine (NORVASC) 5 MG tablet Take 5 mg by mouth daily.     ASPIRIN 81 81 MG EC tablet Take 81 mg by mouth daily.     brimonidine (ALPHAGAN) 0.2 % ophthalmic solution Place 1 drop into both eyes 2 (two) times daily.     clopidogrel (PLAVIX) 75 MG tablet Take 75 mg by mouth daily.     dorzolamide (TRUSOPT) 2 % ophthalmic solution Place 1 drop into both eyes 2 (two) times daily.     hydrochlorothiazide (HYDRODIURIL) 25 MG tablet Take 25 mg by mouth daily.     Multiple Vitamins-Minerals (MULTIVITAMIN WITH MINERALS) tablet Take 1 tablet by mouth daily.     olmesartan (BENICAR) 40 MG tablet Take 40 mg by mouth daily.     vitamin B-12 (CYANOCOBALAMIN) 1000 MCG tablet Take 1,000 mcg by mouth daily.     No current facility-administered medications for this   visit.      REVIEW OF SYSTEMS (Negative unless checked) Provided by her daughter.  Patient cannot provide. Constitutional: []Weight loss  []Fever  []Chills Cardiac: []Chest pain   []Chest pressure   []Palpitations   []Shortness of breath when laying flat   []Shortness of breath at rest   []Shortness of breath with exertion. Vascular:  []Pain in legs with walking   []Pain in legs at rest   []Pain in legs when laying flat   []Claudication   []Pain in feet when walking  []Pain in feet at rest  []Pain in feet when laying flat   []History of DVT   []Phlebitis   []Swelling in legs   []Varicose veins   [x]Non-healing ulcers Pulmonary:   []Uses home oxygen   []Productive cough   []Hemoptysis    []Wheeze  []COPD   []Asthma Neurologic:  []Dizziness  []Blackouts   []Seizures   [x]History of stroke   []History of TIA  []Aphasia   []Temporary blindness   []Dysphagia   []Weakness or numbness in arms   []Weakness or numbness in legs X significant dementia with memory issues Musculoskeletal:  []Arthritis   []Joint swelling   []Joint pain   []Low back pain Hematologic:  []Easy bruising  []Easy bleeding   []Hypercoagulable state   []Anemic  []Hepatitis Gastrointestinal:  []Blood in stool   []Vomiting blood  []Gastroesophageal reflux/heartburn   []Abdominal pain Genitourinary:  []Chronic kidney disease   []Difficult urination  []Frequent urination  []Burning with urination   []Hematuria Skin:  []Rashes   [x]Ulcers   [x]Wounds Psychological:  []History of anxiety   [] History of major depression.    Physical Exam BP 123/66 (BP Location: Right Arm)   Pulse 73   Resp 18  Gen:  thin and frail appearing, NAD Head: [place]/AT, + temporalis wasting.  Ear/Nose/Throat: Hearing grossly intact, nares w/o erythema or drainage, oropharynx w/o Erythema/Exudate Eyes: Conjunctiva clear, sclera non-icteric  Neck: trachea midline.  No JVD.  Pulmonary:  Good air movement, respirations not labored, no use of accessory muscles  Cardiac: Irregular Vascular:  Vessel Right Left  Radial Palpable Palpable                          DP NP NP  PT NP NP   Gastrointestinal:. No masses, surgical incisions, or scars. Musculoskeletal: M/S 5/5 throughout.  Extremities without ischemic changes.  No deformity or atrophy. Black discoloration of the left great toe with wound at the nail bed. No edema. Neurologic: Sensation grossly intact in extremities.   Psychiatric: Judgment and insight are poor. Dermatologic: wound in the left great nail bed    Radiology No results found.  Labs No results found for this or any previous visit (from the past 2160 hour(s)).  Assessment/Plan:  Hypertension blood pressure control  important in reducing the progression of atherosclerotic disease. On appropriate oral medications.   Dementia (HCC) Seems fairly profound and the patient is quite debilitated but this is clearly a critical and limb threatening situation with her foot.  Atherosclerosis of native arteries of the extremities with gangrene (HCC) ABIs today demonstrated monophasic waveforms bilaterally with a right ABI of 0.67 and a left ABI of 0.63.  No digital pressure could be detected in either lower extremity.  This represents a critical and limb threatening situation.  Even given her dementia and poor functional status, attempts at percutaneous revascularization should be given to try to avoid any major surgical therapy such   as amputation which would likely be required without any intervention.  She already has early gangrenous changes of the great toe, this is very likely to progress with her poor perfusion.  I had a long discussion with her daughter today regarding the difficult nature of the situation.  I discussed the risks and benefits of the procedure.  Will be very light with her sedation and we will try to get her home to limit sundowning and mental status changes over night.  I discussed that even with revascularization, there is no guarantee of limb salvage.  Also discussed that her disease may be too extensive for revascularization.  Her daughter is agreeable to proceed with angiography in the near future at her convenience.      Nechelle Petrizzo 09/06/2022, 3:54 PM   This note was created with Dragon medical transcription system.  Any errors from dictation are unintentional.   

## 2022-09-06 NOTE — Assessment & Plan Note (Signed)
blood pressure control important in reducing the progression of atherosclerotic disease. On appropriate oral medications.  

## 2022-09-06 NOTE — Assessment & Plan Note (Signed)
ABIs today demonstrated monophasic waveforms bilaterally with a right ABI of 0.67 and a left ABI of 0.63.  No digital pressure could be detected in either lower extremity.  This represents a critical and limb threatening situation.  Even given her dementia and poor functional status, attempts at percutaneous revascularization should be given to try to avoid any major surgical therapy such as amputation which would likely be required without any intervention.  She already has early gangrenous changes of the great toe, this is very likely to progress with her poor perfusion.  I had a long discussion with her daughter today regarding the difficult nature of the situation.  I discussed the risks and benefits of the procedure.  Will be very light with her sedation and we will try to get her home to limit sundowning and mental status changes over night.  I discussed that even with revascularization, there is no guarantee of limb salvage.  Also discussed that her disease may be too extensive for revascularization.  Her daughter is agreeable to proceed with angiography in the near future at her convenience.

## 2022-09-06 NOTE — H&P (View-Only) (Signed)
Patient ID: Erin Greer, female   DOB: [birth year], 89 y.o.   MRN: [id number]  Chief Complaint  Patient presents with   New Patient (Initial Visit)    np. consult. left leg edema. rerferred by Gayland Curry    HPI Erin Greer is a 89 y.o. female.  I am asked to see the patient by Dr. Astrid Divine and Dr. Luana Shu for evaluation of peripheral arterial disease with black discoloration of her left great toe as well as nonhealing a toenail removal of the left great toe.  Patient has fairly profound dementia at this point and her daughter provides the history.  About 7 years ago, she underwent extensive left lower extremity revascularization and I do not see that this has been checked in the last several years.  There was discoloration of the toe noted by her podiatrist and with her previous history, she is referred for further evaluation and treatment.  No fevers or chills.  Difficult to assess how much this hurts her.  To assess her lower extremity perfusion with a gangrenous changes to the left great toe, ABIs were done today.  The right ABI was 0.67 and the left ABI 0.63 with poor monophasic waveforms.  No digital pressure was detectable in either lower extremity.   Past Medical History:  Diagnosis Date   Dementia ([place])    High cholesterol    Hypertension    Stroke [facility])     Past Surgical History:  Procedure Laterality Date   ABDOMINAL HYSTERECTOMY     PERIPHERAL VASCULAR CATHETERIZATION N/A 10/13/2015   Procedure: Abdominal Aortogram w/Lower Extremity;  Surgeon: Katha Cabal, MD;  Location: [place] [facility];  Service: Cardiovascular;  Laterality: N/A;   PERIPHERAL VASCULAR CATHETERIZATION  10/13/2015   Procedure: Lower Extremity Intervention;  Surgeon: Katha Cabal, MD;  Location: [place] [facility];  Service: Cardiovascular;;     Family History  Problem Relation Age of Onset   Breast cancer Neg Hx   No bleeding or clotting disorders No  aneurysms   Social History   Tobacco Use   Smoking status: Former    Types: Cigarettes   Smokeless tobacco: Never  Vaping Use   Vaping Use: Never used  Substance Use Topics   Alcohol use: No   Drug use: No     No Known Allergies  Current Outpatient Medications  Medication Sig Dispense Refill   acetaminophen (TYLENOL) 325 MG tablet Take 1-2 tablets (325-650 mg total) by mouth every 4 (four) hours as needed for mild pain (or temp >/= 101 F).     amLODipine (NORVASC) 5 MG tablet Take 5 mg by mouth daily.     ASPIRIN 81 81 MG EC tablet Take 81 mg by mouth daily.     brimonidine (ALPHAGAN) 0.2 % ophthalmic solution Place 1 drop into both eyes 2 (two) times daily.     clopidogrel (PLAVIX) 75 MG tablet Take 75 mg by mouth daily.     dorzolamide (TRUSOPT) 2 % ophthalmic solution Place 1 drop into both eyes 2 (two) times daily.     hydrochlorothiazide (HYDRODIURIL) 25 MG tablet Take 25 mg by mouth daily.     Multiple Vitamins-Minerals (MULTIVITAMIN WITH MINERALS) tablet Take 1 tablet by mouth daily.     olmesartan (BENICAR) 40 MG tablet Take 40 mg by mouth daily.     vitamin B-12 (CYANOCOBALAMIN) 1000 MCG tablet Take 1,000 mcg by mouth daily.     No current facility-administered medications for this  visit.      REVIEW OF SYSTEMS (Negative unless checked) Provided by her daughter.  Patient cannot provide. Constitutional: '[]'$ Weight loss  '[]'$ Fever  '[]'$ Chills Cardiac: '[]'$ Chest pain   '[]'$ Chest pressure   '[]'$ Palpitations   '[]'$ Shortness of breath when laying flat   '[]'$ Shortness of breath at rest   '[]'$ Shortness of breath with exertion. Vascular:  '[]'$ Pain in legs with walking   '[]'$ Pain in legs at rest   '[]'$ Pain in legs when laying flat   '[]'$ Claudication   '[]'$ Pain in feet when walking  '[]'$ Pain in feet at rest  '[]'$ Pain in feet when laying flat   '[]'$ History of DVT   '[]'$ Phlebitis   '[]'$ Swelling in legs   '[]'$ Varicose veins   '[x]'$ Non-healing ulcers Pulmonary:   '[]'$ Uses home oxygen   '[]'$ Productive cough   '[]'$ Hemoptysis    '[]'$ Wheeze  '[]'$ COPD   '[]'$ Asthma Neurologic:  '[]'$ Dizziness  '[]'$ Blackouts   '[]'$ Seizures   '[x]'$ History of stroke   '[]'$ History of TIA  '[]'$ Aphasia   '[]'$ Temporary blindness   '[]'$ Dysphagia   '[]'$ Weakness or numbness in arms   '[]'$ Weakness or numbness in legs X significant dementia with memory issues Musculoskeletal:  '[]'$ Arthritis   '[]'$ Joint swelling   '[]'$ Joint pain   '[]'$ Low back pain Hematologic:  '[]'$ Easy bruising  '[]'$ Easy bleeding   '[]'$ Hypercoagulable state   '[]'$ Anemic  '[]'$ Hepatitis Gastrointestinal:  '[]'$ Blood in stool   '[]'$ Vomiting blood  '[]'$ Gastroesophageal reflux/heartburn   '[]'$ Abdominal pain Genitourinary:  '[]'$ Chronic kidney disease   '[]'$ Difficult urination  '[]'$ Frequent urination  '[]'$ Burning with urination   '[]'$ Hematuria Skin:  '[]'$ Rashes   '[x]'$ Ulcers   '[x]'$ Wounds Psychological:  '[]'$ History of anxiety   '[]'$  History of major depression.    Physical Exam BP 123/66 (BP Location: Right Arm)   Pulse 73   Resp 18  Gen:  thin and frail appearing, NAD Head: [place]/AT, + temporalis wasting.  Ear/Nose/Throat: Hearing grossly intact, nares w/o erythema or drainage, oropharynx w/o Erythema/Exudate Eyes: Conjunctiva clear, sclera non-icteric  Neck: trachea midline.  No JVD.  Pulmonary:  Good air movement, respirations not labored, no use of accessory muscles  Cardiac: Irregular Vascular:  Vessel Right Left  Radial Palpable Palpable                          DP NP NP  PT NP NP   Gastrointestinal:. No masses, surgical incisions, or scars. Musculoskeletal: M/S 5/5 throughout.  Extremities without ischemic changes.  No deformity or atrophy. Black discoloration of the left great toe with wound at the nail bed. No edema. Neurologic: Sensation grossly intact in extremities.   Psychiatric: Judgment and insight are poor. Dermatologic: wound in the left great nail bed    Radiology No results found.  Labs No results found for this or any previous visit (from the past 2160 hour(s)).  Assessment/Plan:  Hypertension blood pressure control  important in reducing the progression of atherosclerotic disease. On appropriate oral medications.   Dementia ([place]) Seems fairly profound and the patient is quite debilitated but this is clearly a critical and limb threatening situation with her foot.  Atherosclerosis of native arteries of the extremities with gangrene ([place]) ABIs today demonstrated monophasic waveforms bilaterally with a right ABI of 0.67 and a left ABI of 0.63.  No digital pressure could be detected in either lower extremity.  This represents a critical and limb threatening situation.  Even given her dementia and poor functional status, attempts at percutaneous revascularization should be given to try to avoid any major surgical therapy such  as amputation which would likely be required without any intervention.  She already has early gangrenous changes of the great toe, this is very likely to progress with her poor perfusion.  I had a long discussion with her daughter today regarding the difficult nature of the situation.  I discussed the risks and benefits of the procedure.  Will be very light with her sedation and we will try to get her home to limit sundowning and mental status changes over night.  I discussed that even with revascularization, there is no guarantee of limb salvage.  Also discussed that her disease may be too extensive for revascularization.  Her daughter is agreeable to proceed with angiography in the near future at her convenience.      [name] 09/06/2022, 3:54 PM   This note was created with Dragon medical transcription system.  Any errors from dictation are unintentional.

## 2022-09-09 LAB — VAS US ABI WITH/WO TBI
Left ABI: 0.63
Right ABI: 0.67

## 2022-09-16 ENCOUNTER — Telehealth (INDEPENDENT_AMBULATORY_CARE_PROVIDER_SITE_OTHER): Payer: Self-pay

## 2022-09-16 NOTE — Telephone Encounter (Signed)
Spoke with the patient's daughter and the patient is scheduled with Dr. Lucky Cowboy on 09/26/22 for a left leg angio with [facility] 10:15 am [facility]. Pre-procedure instructions were discussed and will be mailed. Patient's daughter was offered 09/19/22 and declined stating it was too early.

## 2022-09-26 ENCOUNTER — Encounter: Payer: Self-pay | Admitting: Vascular Surgery

## 2022-09-26 ENCOUNTER — Ambulatory Visit
Admission: RE | Admit: 2022-09-26 | Discharge: 2022-09-26 | Disposition: A | Discharge status: Home / Self Care | Payer: Medicare Other | Attending: Vascular Surgery | Admitting: Vascular Surgery

## 2022-09-26 ENCOUNTER — Other Ambulatory Visit: Payer: Self-pay

## 2022-09-26 ENCOUNTER — Encounter
Admission: RE | Disposition: A | Discharge status: Home / Self Care | Payer: Self-pay | Source: Home / Self Care | Attending: Vascular Surgery

## 2022-09-26 DIAGNOSIS — Z95828 Presence of other vascular implants and grafts: Secondary | ICD-10-CM

## 2022-09-26 DIAGNOSIS — F0390 Unspecified dementia without behavioral disturbance: Secondary | ICD-10-CM | POA: Diagnosis not present

## 2022-09-26 DIAGNOSIS — Z87891 Personal history of nicotine dependence: Secondary | ICD-10-CM | POA: Diagnosis not present

## 2022-09-26 DIAGNOSIS — I70262 Atherosclerosis of native arteries of extremities with gangrene, left leg: Secondary | ICD-10-CM | POA: Insufficient documentation

## 2022-09-26 DIAGNOSIS — I10 Essential (primary) hypertension: Secondary | ICD-10-CM | POA: Insufficient documentation

## 2022-09-26 DIAGNOSIS — I70269 Atherosclerosis of native arteries of extremities with gangrene, unspecified extremity: Secondary | ICD-10-CM

## 2022-09-26 DIAGNOSIS — L97529 Non-pressure chronic ulcer of other part of left foot with unspecified severity: Secondary | ICD-10-CM

## 2022-09-26 HISTORY — PX: LOWER EXTREMITY ANGIOGRAPHY: CATH118251

## 2022-09-26 LAB — CREATININE, SERUM
Creatinine, Ser: 0.68 mg/dL (ref 0.44–1.00)
GFR, Estimated: >60 mL/min (ref >60)

## 2022-09-26 LAB — BUN: BUN: 24 mg/dL — ABNORMAL HIGH (ref 8–23)

## 2022-09-26 SURGERY — LOWER EXTREMITY ANGIOGRAPHY
Anesthesia: Moderate Sedation | Site: Leg Lower | Laterality: Left

## 2022-09-26 MED ORDER — LABETALOL HCL 5 MG/ML IV SOLN: 10.0000 mg | INTRAVENOUS | Status: DC | PRN

## 2022-09-26 MED ORDER — NITROGLYCERIN 1 MG/10 ML FOR IR/CATH LAB
INTRA_ARTERIAL | Status: DC | PRN
  Administered 2022-09-26: 400 ug via INTRA_ARTERIAL

## 2022-09-26 MED ORDER — SODIUM CHLORIDE 0.9 % IV SOLN: INTRAVENOUS | Status: DC

## 2022-09-26 MED ORDER — HYDRALAZINE HCL 20 MG/ML IJ SOLN
INTRAMUSCULAR | Status: AC
  Administered 2022-09-26: 5 mg via INTRAVENOUS
  Filled 2022-09-26: qty 1

## 2022-09-26 MED ORDER — CEFAZOLIN SODIUM-DEXTROSE 2-4 GM/100ML-% IV SOLN: 2.0000 g | INTRAVENOUS | Status: AC

## 2022-09-26 MED ORDER — METHYLPREDNISOLONE SODIUM SUCC 125 MG IJ SOLR: 125.0000 mg | Freq: Once | INTRAMUSCULAR | Status: DC | PRN

## 2022-09-26 MED ORDER — FAMOTIDINE 20 MG PO TABS: 40.0000 mg | ORAL_TABLET | Freq: Once | ORAL | Status: DC | PRN

## 2022-09-26 MED ORDER — ONDANSETRON HCL 4 MG/2ML IJ SOLN: 4.0000 mg | Freq: Four times a day (QID) | INTRAMUSCULAR | Status: DC | PRN

## 2022-09-26 MED ORDER — MIDAZOLAM HCL 2 MG/2ML IJ SOLN
INTRAMUSCULAR | Status: AC
  Filled 2022-09-26: qty 2

## 2022-09-26 MED ORDER — SODIUM CHLORIDE 0.9% FLUSH: 3.0000 mL | INTRAVENOUS | Status: DC | PRN

## 2022-09-26 MED ORDER — FENTANYL CITRATE (PF) 100 MCG/2ML IJ SOLN
INTRAMUSCULAR | Status: DC | PRN
  Administered 2022-09-26 (×4): 12.5 ug via INTRAVENOUS

## 2022-09-26 MED ORDER — NITROGLYCERIN 1 MG/10 ML FOR IR/CATH LAB
INTRA_ARTERIAL | Status: AC
  Filled 2022-09-26: qty 10

## 2022-09-26 MED ORDER — ATORVASTATIN CALCIUM 10 MG PO TABS: 10.0000 mg | ORAL_TABLET | Freq: Every day | ORAL | 11 refills | Status: DC

## 2022-09-26 MED ORDER — DIPHENHYDRAMINE HCL 50 MG/ML IJ SOLN: 50.0000 mg | Freq: Once | INTRAMUSCULAR | Status: DC | PRN

## 2022-09-26 MED ORDER — SODIUM CHLORIDE 0.9 % IV SOLN: 250.0000 mL | INTRAVENOUS | Status: DC | PRN

## 2022-09-26 MED ORDER — ATORVASTATIN CALCIUM 10 MG PO TABS
10.0000 mg | ORAL_TABLET | Freq: Every day | ORAL | Status: DC
  Filled 2022-09-26: qty 1

## 2022-09-26 MED ORDER — FENTANYL CITRATE PF 50 MCG/ML IJ SOSY
PREFILLED_SYRINGE | INTRAMUSCULAR | Status: AC
  Filled 2022-09-26: qty 1

## 2022-09-26 MED ORDER — LABETALOL HCL 5 MG/ML IV SOLN
INTRAVENOUS | Status: DC | PRN
  Administered 2022-09-26: 20 mg via INTRAVENOUS

## 2022-09-26 MED ORDER — HYDRALAZINE HCL 20 MG/ML IJ SOLN
5.0000 mg | INTRAMUSCULAR | Status: AC | PRN
  Administered 2022-09-26: 5 mg via INTRAVENOUS

## 2022-09-26 MED ORDER — CEFAZOLIN SODIUM-DEXTROSE 2-4 GM/100ML-% IV SOLN
INTRAVENOUS | Status: AC
  Administered 2022-09-26: 2 g via INTRAVENOUS
  Filled 2022-09-26: qty 100

## 2022-09-26 MED ORDER — ACETAMINOPHEN 325 MG PO TABS: 650.0000 mg | ORAL_TABLET | ORAL | Status: DC | PRN

## 2022-09-26 MED ORDER — HYDROMORPHONE HCL 1 MG/ML IJ SOLN: 1.0000 mg | Freq: Once | INTRAMUSCULAR | Status: DC | PRN

## 2022-09-26 MED ORDER — LABETALOL HCL 5 MG/ML IV SOLN
INTRAVENOUS | Status: AC
  Filled 2022-09-26: qty 4

## 2022-09-26 MED ORDER — SODIUM CHLORIDE 0.9% FLUSH: 3.0000 mL | Freq: Two times a day (BID) | INTRAVENOUS | Status: DC

## 2022-09-26 MED ORDER — HEPARIN SODIUM (PORCINE) 1000 UNIT/ML IJ SOLN
INTRAMUSCULAR | Status: AC
  Filled 2022-09-26: qty 10

## 2022-09-26 MED ORDER — MIDAZOLAM HCL 2 MG/ML PO SYRP: 8.0000 mg | ORAL_SOLUTION | Freq: Once | ORAL | Status: DC | PRN

## 2022-09-26 MED ORDER — IODIXANOL 320 MG/ML IV SOLN
INTRAVENOUS | Status: DC | PRN
  Administered 2022-09-26: 80 mL via INTRA_ARTERIAL

## 2022-09-26 MED ORDER — MIDAZOLAM HCL 2 MG/2ML IJ SOLN
INTRAMUSCULAR | Status: DC | PRN
  Administered 2022-09-26 (×4): .5 mg via INTRAVENOUS

## 2022-09-26 MED ORDER — HEPARIN SODIUM (PORCINE) 1000 UNIT/ML IJ SOLN
INTRAMUSCULAR | Status: DC | PRN
  Administered 2022-09-26: 4000 [IU] via INTRAVENOUS

## 2022-09-26 SURGICAL SUPPLY — 31 items
BALLN DORADO 5X200X135 (BALLOONS) ×1
BALLN LUTONIX 018 4X220X130 (BALLOONS) ×1
BALLN LUTONIX 018 4X300X130 (BALLOONS) ×1
BALLN LUTONIX 018 5X60X130 (BALLOONS) ×1
BALLN ULTRVRSE 2.5X220X150 (BALLOONS) ×1
BALLN ULTRVRSE 3X80X150 (BALLOONS) ×1
BALLN ULTRVRSE 3X80X150 OTW (BALLOONS) ×1
BALLOON DORADO 5X200X135 (BALLOONS) IMPLANT
BALLOON LUTONIX 018 4X220X130 (BALLOONS) IMPLANT
BALLOON LUTONIX 018 4X300X130 (BALLOONS) IMPLANT
BALLOON LUTONIX 018 5X60X130 (BALLOONS) IMPLANT
BALLOON ULTRVRSE 2.5X220X150 (BALLOONS) IMPLANT
BALLOON ULTRVRSE 3X80X150 OTW (BALLOONS) IMPLANT
CATH ANGIO 5F PIGTAIL 65CM (CATHETERS) IMPLANT
CATH BEACON 5 .035 65 KMP TIP (CATHETERS) IMPLANT
CATH BEACON 5 .038 100 VERT TP (CATHETERS) IMPLANT
CATH NAVICROSS ANGLED 90CM (MICROCATHETER) IMPLANT
DEVICE SAFEGUARD 24CM (GAUZE/BANDAGES/DRESSINGS) IMPLANT
DEVICE STARCLOSE SE CLOSURE (Vascular Products) IMPLANT
GLIDEWIRE ADV .035X260CM (WIRE) IMPLANT
GUIDEWIRE PFTE-COATED .018X300 (WIRE) IMPLANT
KIT ENCORE 26 ADVANTAGE (KITS) IMPLANT
PACK ANGIOGRAPHY (CUSTOM PROCEDURE TRAY) ×1 IMPLANT
SHEATH ANL2 6FRX45 HC (SHEATH) IMPLANT
SHEATH BRITE TIP 5FRX11 (SHEATH) IMPLANT
STENT VIABAHN 5X250X120 (Permanent Stent) IMPLANT
STENT VIABAHN 6X50X120 (Permanent Stent) IMPLANT
SYR MEDRAD MARK 7 150ML (SYRINGE) IMPLANT
TUBING CONTRAST HIGH PRESS 72 (TUBING) IMPLANT
WIRE G V18X300CM (WIRE) IMPLANT
WIRE GUIDERIGHT .035X150 (WIRE) IMPLANT

## 2022-09-26 NOTE — Progress Notes (Signed)
Pt cleaned and dried by this RN and Tye Maryland, RN, dressed and placed in wheelchair and then placed in car, [place] with daughters

## 2022-09-26 NOTE — Op Note (Signed)
[facility]  Percutaneous Study/Intervention Procedural Note   Date of Surgery: 09/26/2022  Surgeon(s):Inari Shin    Assistants:none  Pre-operative Diagnosis: PAD with ulceration and gangrenous changes left foot  Post-operative diagnosis:  Same  Procedure(s) Performed:             1.  Ultrasound guidance for vascular access right femoral artery             2.  Catheter placement into left common femoral artery from right femoral approach             3.  Aortogram and selective right lower extremity angiogram             4.  Percutaneous transluminal angioplasty of left peroneal artery and tibioperoneal trunk with 2.5 mm diameter and 3 mm diameter angioplasty balloons             5.  Percutaneous transluminal angioplasty of left SFA and popliteal arteries with 4 mm diameter Lutonix drug-coated angioplasty balloons  6.  Stent placement to the left popliteal artery and tibioperoneal trunk with 5 mm diameter by 25 cm length Viabahn stent  7.  Stent placement to the left SFA and popliteal arteries with 5 mm diameter by 25 cm length Viabahn stent and 6 mm diameter by 5 cm length Viabahn stent             8.  StarClose closure device right femoral artery  EBL: 10 cc  Contrast: 80 cc  Fluoro Time: 15.6 minutes  Moderate Conscious Sedation Time: approximately 85 minutes using 2 mg of Versed and 50 mcg of Fentanyl              Indications:  Patient is a 89 y.o.female with gangrenous changes to her left great toe and a nonhealing ulceration. The patient has noninvasive study showing markedly reduced ABIs bilaterally. The patient is brought in for angiography for further evaluation and potential treatment.  Due to the limb threatening nature of the situation, angiogram was performed for attempted limb salvage. The patient is aware that if the procedure fails, amputation would be expected.  The patient also understands that even with successful revascularization, amputation  may still be required due to the severity of the situation.  Risks and benefits are discussed and informed consent is obtained.   Procedure:  The patient was identified and appropriate procedural time out was performed.  The patient was then placed supine on the table and prepped and draped in the usual sterile fashion. Moderate conscious sedation was administered during a face to face encounter with the patient throughout the procedure with my supervision of the RN administering medicines and monitoring the patient's vital signs, pulse oximetry, telemetry and mental status throughout from the start of the procedure until the patient was taken to the recovery room. Ultrasound was used to evaluate the right common femoral artery.  It was patent .  A digital ultrasound image was acquired.  A Seldinger needle was used to access the right common femoral artery under direct ultrasound guidance and a permanent image was performed.  A 0.035 J wire was advanced without resistance and a 5Fr sheath was placed.  Pigtail catheter was placed into the aorta and an AP aortogram was performed. This demonstrated normal renal arteries and an irregular but not stenotic aorta and iliac segments without significant stenosis including patency of the previously place left iliac stent. I then crossed the aortic bifurcation and advanced to the left femoral head.  Selective left lower extremity angiogram was then performed. This demonstrated calcific but not stenotic common femoral artery and profunda femoris artery.  The SFA had a flush occlusion with reconstitution in the proximal popliteal artery which was heavily diseased.  There was another stenosis in the mid popliteal artery of greater than 75%.  The tibioperoneal trunk had about a 90% stenosis that traversed into the proximal peroneal artery which was the only runoff distally. It was felt that it was in the patient's best interest to proceed with intervention after these images to  avoid a second procedure and a larger amount of contrast and fluoroscopy based off of the findings from the initial angiogram. The patient was systemically heparinized and a 6 [place] Ansell sheath was then placed over the [employer] wire. I then used a Kumpe catheter and the advantage wire to cross the occlusion in the SFA and popliteal arteries with some difficulty ultimately exchanging for a CXI catheter and the 0.018 advantage wire and confirming intraluminal flow in the peroneal artery.  I then replaced the V18 wire.  The peroneal artery and tibioperoneal trunk were treated with a 2.5 mm diameter angioplasty balloon.  This was used to predilate the highly calcific occlusion in the SFA and popliteal arteries.  I then treated the SFA and popliteal arteries with a 4 mm diameter by 30 and a 4 mm diameter by 22 cm length Lutonix drug-coated angioplasty balloon inflated to 10 to 12 atm for 1 minute.  Completion imaging showed severe diffuse disease throughout and we required long segment stents from the proximal SFA down to the tibioperoneal trunk for any hope of patency.  A 5 mm diameter by 25 cm length Viabahn stent was deployed in the tibioperoneal trunk and popliteal artery.  Another 5 mm diameter by 25 cm length Viabahn stent was deployed in the SFA and finally a 6 mm diameter by 5 cm length Viabahn stent was deployed in the proximal SFA up to very near the origin.  I postdilated with a 3 mm diameter angioplasty balloon in the tibioperoneal trunk and used a 3 mm diameter angioplasty balloon to treat the proximal peroneal artery inflating this to 6 atm for 1 minute.  I used a 5 mm diameter by 20 cm length [place] high-pressure angioplasty balloons due to the constraint from her heavily calcified arteries throughout the SFA and popliteal arteries.  Completion imaging showed markedly improved flow.  There were a couple of constrained areas within the stents in the 15 to 20% range and about a 30% stenosis in the  tibioperoneal trunk and proximal peroneal artery just below the previously placed stent but a marked improvement with brisk flow distally that I did not feel like we can improve upon any further. I elected to terminate the procedure. The sheath was removed and StarClose closure device was deployed in the right femoral artery with excellent hemostatic result. The patient was taken to the recovery room in stable condition having tolerated the procedure well.  Findings:               Aortogram:  This demonstrated normal renal arteries and an irregular but not stenotic aorta and iliac segments without significant stenosis including patency of the previously place left iliac stent.             Left Lower Extremity:  This demonstrated calcific but not stenotic common femoral artery and profunda femoris artery.  The SFA had a flush occlusion with reconstitution in the proximal popliteal  artery which was heavily diseased.  There was another stenosis in the mid popliteal artery of greater than 75%.  The tibioperoneal trunk had about a 90% stenosis that traversed into the proximal peroneal artery which was the only runoff distally.   Disposition: Patient was taken to the recovery room in stable condition having tolerated the procedure well.  Complications: None  [name] 09/26/2022 12:49 PM   This note was created with Dragon Medical transcription system. Any errors in dictation are purely unintentional.

## 2022-09-26 NOTE — Interval H&P Note (Signed)
History and Physical Interval Note:  09/26/2022 9:47 AM  Erin Greer  has presented today for surgery, with the diagnosis of LLE Angio   BARD   ASO w gangrene.  The various methods of treatment have been discussed with the patient and family. After consideration of risks, benefits and other options for treatment, the patient has consented to  Procedure(s): Lower Extremity Angiography (Left) as a surgical intervention.  The patient's history has been reviewed, patient examined, no change in status, stable for surgery.  I have reviewed the patient's chart and labs.  Questions were answered to the patient's satisfaction.     [name]

## 2022-09-26 NOTE — Progress Notes (Signed)
Both daughters at bedside talking with Dr Lucky Cowboy; pt sleeping

## 2022-09-26 NOTE — Progress Notes (Signed)
Pt sitting up and drinking OJ

## 2022-09-27 ENCOUNTER — Encounter: Payer: Self-pay | Admitting: Vascular Surgery

## 2022-09-27 ENCOUNTER — Telehealth (INDEPENDENT_AMBULATORY_CARE_PROVIDER_SITE_OTHER): Payer: Self-pay

## 2022-09-27 NOTE — Telephone Encounter (Signed)
Erin Greer's daughter called asking should she be worried about her mother developing a fever after havinga angio yesterday. Tempetures of 101.7 this morning of , 99.8 @ 1pm, and 101.1 @ 3:22pm. No medications have been given to reduce the fever.

## 2022-09-28 NOTE — Telephone Encounter (Signed)
This may be a stress response to the procedure.  I advised taking Tylenol or ibuprofen.  Also make sure she is hydrating as this may be a little bit of a reaction to some of the contrast that she received.  She should be drinking plenty of fluids as a fever also tends to make you somewhat dehydrated.

## 2022-09-28 NOTE — Telephone Encounter (Signed)
I called Erin Greer with message below, Hollyn's fever went down after talking to her daughter yesterday. She stated after the use of ibuprofen and tylenol over night the fever stayed between 98.8, 98.6 and then back to 100.2 this morning and 100.6 when we were on the phone. The patient will continue to take ibuprofen and drink fluids through out the day.

## 2022-09-29 ENCOUNTER — Encounter: Payer: Self-pay | Admitting: Vascular Surgery

## 2022-10-17 ENCOUNTER — Other Ambulatory Visit (INDEPENDENT_AMBULATORY_CARE_PROVIDER_SITE_OTHER): Payer: Self-pay | Admitting: Vascular Surgery

## 2022-10-17 DIAGNOSIS — I739 Peripheral vascular disease, unspecified: Secondary | ICD-10-CM

## 2022-10-20 ENCOUNTER — Ambulatory Visit (INDEPENDENT_AMBULATORY_CARE_PROVIDER_SITE_OTHER): Payer: Medicare Other | Admitting: Nurse Practitioner

## 2022-10-20 ENCOUNTER — Encounter (INDEPENDENT_AMBULATORY_CARE_PROVIDER_SITE_OTHER): Payer: Self-pay | Admitting: Nurse Practitioner

## 2022-10-20 ENCOUNTER — Ambulatory Visit (INDEPENDENT_AMBULATORY_CARE_PROVIDER_SITE_OTHER): Payer: Medicare Other

## 2022-10-20 VITALS — BP 153/73 | HR 69 | Resp 15

## 2022-10-20 DIAGNOSIS — Z9889 Other specified postprocedural states: Secondary | ICD-10-CM

## 2022-10-20 DIAGNOSIS — I739 Peripheral vascular disease, unspecified: Secondary | ICD-10-CM

## 2022-10-20 DIAGNOSIS — I70262 Atherosclerosis of native arteries of extremities with gangrene, left leg: Secondary | ICD-10-CM

## 2022-10-20 DIAGNOSIS — I10 Essential (primary) hypertension: Secondary | ICD-10-CM

## 2022-10-20 NOTE — Progress Notes (Signed)
Subjective:    Patient ID: Erin Greer, female    DOB: June 11, 1928, 89 y.o.   MRN: [id number] Chief Complaint  Patient presents with   Follow-up    [facility] 3 week follow up    The patient returns to the office for followup and review status post angiogram with intervention on 09/26/2022.   Procedure: Procedure(s) Performed:             1.  Ultrasound guidance for vascular access right femoral artery             2.  Catheter placement into left common femoral artery from right femoral approach             3.  Aortogram and selective right lower extremity angiogram             4.  Percutaneous transluminal angioplasty of left peroneal artery and tibioperoneal trunk with 2.5 mm diameter and 3 mm diameter angioplasty balloons             5.  Percutaneous transluminal angioplasty of left SFA and popliteal arteries with 4 mm diameter Lutonix drug-coated angioplasty balloons             6.  Stent placement to the left popliteal artery and tibioperoneal trunk with 5 mm diameter by 25 cm length Viabahn stent             7.  Stent placement to the left SFA and popliteal arteries with 5 mm diameter by 25 cm length Viabahn stent and 6 mm diameter by 5 cm length Viabahn stent             8.  StarClose closure device right femoral artery   The patient has severe dementia and her daughter is providing much of the history.  The patient had discoloration and gangrenous changes which are improving postintervention.  No interval shortening of the patient's claudication distance or rest pain symptoms. No new ulcers or wounds have occurred since the last visit.  There have been no significant changes to the patient's overall health care.  No documented history of amaurosis fugax or recent TIA symptoms. There are no recent neurological changes noted. No documented history of DVT, PE or superficial thrombophlebitis. The patient denies recent episodes of angina or shortness of breath.   ABI's Rt=0.73  and Lt=1.10  (previous ABI's Rt=0.67 and Lt=0.63) Duplex US of the bilateral tibial arteries reveals monophasic waveforms.  Slightly dampened toe waveforms bilaterally.  Studies were slightly difficult due to patient movement and dementia    Review of Systems  Neurological:  Positive for weakness.  Psychiatric/Behavioral:  Positive for decreased concentration.   All other systems reviewed and are negative.      Objective:   Physical Exam Vitals reviewed.  HENT:     Head: Normocephalic.  Cardiovascular:     Rate and Rhythm: Normal rate.     Pulses:          Dorsalis pedis pulses are detected w/ Doppler on the right side and detected w/ Doppler on the left side.       Posterior tibial pulses are detected w/ Doppler on the right side and detected w/ Doppler on the left side.  Pulmonary:     Effort: Pulmonary effort is normal.  Skin:    General: Skin is warm and dry.  Neurological:     Mental Status: She is alert and oriented to person, place, and time.     Motor: Weakness  present.  Psychiatric:        Attention and Perception: She is inattentive.        Speech: She is noncommunicative.        Behavior: Behavior is withdrawn.        Cognition and Memory: Cognition is impaired.        Judgment: Judgment normal.     BP (!) 153/73 (BP Location: Left Arm)   Pulse 69   Resp 15   Past Medical History:  Diagnosis Date   Dementia (HCC)    High cholesterol    Hypertension    Stroke [facility])     Social History   Socioeconomic History   Marital status: Married    Spouse name: Not on file   Number of children: Not on file   Years of education: Not on file   Highest education level: Not on file  Occupational History   Not on file  Tobacco Use   Smoking status: Former    Types: Cigarettes   Smokeless tobacco: Never  Vaping Use   Vaping Use: Never used  Substance and Sexual Activity   Alcohol use: No   Drug use: No   Sexual activity: Not Currently  Other Topics Concern    Not on file  Social History Narrative   Not on file   Social Determinants of Health   Financial Resource Strain: Not on file  Food Insecurity: Not on file  Transportation Needs: Not on file  Physical Activity: Not on file  Stress: Not on file  Social Connections: Not on file  Intimate Partner Violence: Not on file    Past Surgical History:  Procedure Laterality Date   ABDOMINAL HYSTERECTOMY     LOWER EXTREMITY ANGIOGRAPHY Left 09/26/2022   Procedure: Lower Extremity Angiography;  Surgeon: Algernon Huxley, MD;  Location: [facility];  Service: Cardiovascular;  Laterality: Left;   PERIPHERAL VASCULAR CATHETERIZATION N/A 10/13/2015   Procedure: Abdominal Aortogram w/Lower Extremity;  Surgeon: Katha Cabal, MD;  Location: [place] [facility];  Service: Cardiovascular;  Laterality: N/A;   PERIPHERAL VASCULAR CATHETERIZATION  10/13/2015   Procedure: Lower Extremity Intervention;  Surgeon: Katha Cabal, MD;  Location: [place] [facility];  Service: Cardiovascular;;    Family History  Problem Relation Age of Onset   Breast cancer Neg Hx     No Known Allergies     Latest Ref Rng & Units 05/04/2022   11:06 AM 10/17/2020   11:50 PM 05/10/2020    4:40 PM  CBC  WBC 4.0 - 10.5 K/uL 5.3  5.9  5.3   Hemoglobin 12.0 - 15.0 g/dL 11.4  12.7  12.2   Hematocrit 36.0 - 46.0 % 36.4  38.5  37.9   Platelets 150 - 400 K/uL 292  269  216       CMP     Component Value Date/Time   NA 142 05/04/2022 1106   NA 141 01/26/2013 1210   K 4.7 05/04/2022 1106   K 4.1 01/26/2013 1210   CL 106 05/04/2022 1106   CL 100 01/26/2013 1210   CO2 29 05/04/2022 1106   CO2 33 (H) 01/26/2013 1210   GLUCOSE 115 (H) 05/04/2022 1106   GLUCOSE 141 (H) 01/26/2013 1210   BUN 24 (H) 09/26/2022 1017   BUN 11 01/26/2013 1210   CREATININE 0.68 09/26/2022 1017   CREATININE 0.82 01/26/2013 1210   CALCIUM 10.5 (H) 05/04/2022 1106   CALCIUM 11.2 (H) 01/26/2013 1210  PROT 7.6 10/17/2020 2350    PROT 8.7 (H) 01/26/2013 1210   ALBUMIN 4.5 10/17/2020 2350   ALBUMIN 4.6 01/26/2013 1210   AST 19 10/17/2020 2350   AST 24 01/26/2013 1210   ALT 18 10/17/2020 2350   ALT 22 01/26/2013 1210   ALKPHOS 69 10/17/2020 2350   ALKPHOS 77 01/26/2013 1210   BILITOT 0.8 10/17/2020 2350   BILITOT 0.5 01/26/2013 1210   GFRNONAA >60 09/26/2022 1017   GFRNONAA >60 01/26/2013 1210   GFRAA >60 10/13/2015 0705   GFRAA >60 01/26/2013 1210     VAS [place] ABI WITH/WO TBI  Result Date: 09/09/2022  LOWER EXTREMITY DOPPLER STUDY Patient Name:  Thomasena Kostka  Date of Exam:   09/06/2022 Medical Rec #: [id number]               Accession #:    [id number] Date of Birth: [birth year]                Patient Gender: F Patient Age:   89 years Exam Location:  [facility] Procedure:      VAS [place] ABI WITH/WO TBI Referring Phys: Corene Cornea DEW --------------------------------------------------------------------------------  Indications: Peripheral artery disease. High Risk Factors: Hypertension, past history of smoking.  Vascular Interventions: 10/13/2015 Left SFA and popliteal atherectomy. PTA of                         left SFA, pop, and peroneal arteries. Left common iliac                         artery stent. Limitations: Today's exam was limited due to patient unable to lie flat, patient              in wheelchair, involuntary patient movement and Dementia. Performing Technologist: Delorise Shiner RVT  Examination Guidelines: A complete evaluation includes at minimum, Doppler waveform signals and systolic blood pressure reading at the level of bilateral brachial, anterior tibial, and posterior tibial arteries, when vessel segments are accessible. Bilateral testing is considered an integral part of a complete examination. Photoelectric Plethysmograph (PPG) waveforms and toe systolic pressure readings are included as required and additional duplex testing as needed. Limited examinations for reoccurring indications may be  performed as noted.  ABI Findings: +---------+------------------+-----+----------+--------+ Right    Rt Pressure (mmHg)IndexWaveform  Comment  +---------+------------------+-----+----------+--------+ Brachial 147                                       +---------+------------------+-----+----------+--------+ PTA      0                 0.00 absent             +---------+------------------+-----+----------+--------+ PERO     94                0.61 monophasic         +---------+------------------+-----+----------+--------+ DP       103               0.67 monophasic         +---------+------------------+-----+----------+--------+ Great Toe0                 0.00 Absent             +---------+------------------+-----+----------+--------+ +---------+------------------+-----+-------------------+-------+ Left     Lt Pressure (mmHg)IndexWaveform  Comment +---------+------------------+-----+-------------------+-------+ Brachial 153                                               +---------+------------------+-----+-------------------+-------+ PTA      0                 0.00 absent                     +---------+------------------+-----+-------------------+-------+ PERO     88                0.60 dampened monophasic        +---------+------------------+-----+-------------------+-------+ DP       97                0.63 monophasic                 +---------+------------------+-----+-------------------+-------+ Great Toe0                 0.00 Absent                     +---------+------------------+-----+-------------------+-------+ +-------+-----------+-----------+------------+------------+ ABI/TBIToday's ABIToday's TBIPrevious ABIPrevious TBI +-------+-----------+-----------+------------+------------+ Right  0.67       0.00                                +-------+-----------+-----------+------------+------------+ Left   0.63       0.00                                 +-------+-----------+-----------+------------+------------+  Summary: Right: Resting right ankle-brachial index indicates moderate right lower extremity arterial disease. The right toe-brachial index is abnormal. Left: Resting left ankle-brachial index indicates moderate left lower extremity arterial disease. The left toe-brachial index is abnormal. *See table(s) above for measurements and observations.  Electronically signed by [name] MD on 09/09/2022 at 10:00:12 AM.    Final        Assessment & Plan:   1. Atherosclerosis of native artery of left lower extremity with gangrene [facility]) Recommend:  The patient is status post successful angiogram with intervention.  The discoloration is improved and the gangrenous changes are improved  No further invasive studies, angiography or surgery at this time The patient should continue walking and begin a more formal exercise program.  The patient should continue antiplatelet therapy and aggressive treatment of the lipid abnormalities  Continued surveillance is indicated as atherosclerosis is likely to progress with time.    Patient should undergo noninvasive studies as ordered. The patient will follow up with me to review the studies.   2. Primary hypertension Continue antihypertensive medications as already ordered, these medications have been reviewed and there are no changes at this time.   Current Outpatient Medications on File Prior to Visit  Medication Sig Dispense Refill   acetaminophen (TYLENOL) 325 MG tablet Take 1-2 tablets (325-650 mg total) by mouth every 4 (four) hours as needed for mild pain (or temp >/= 101 F).     amLODipine (NORVASC) 5 MG tablet Take 5 mg by mouth daily.     ASPIRIN 81 81 MG EC tablet Take 81 mg by mouth daily.     atorvastatin (LIPITOR) 10 MG tablet Take 1 tablet (10 mg total) by mouth daily.  30 tablet 11   brimonidine (ALPHAGAN) 0.2 % ophthalmic solution Place 1 drop into both eyes  2 (two) times daily.     clopidogrel (PLAVIX) 75 MG tablet Take 75 mg by mouth daily.     dorzolamide (TRUSOPT) 2 % ophthalmic solution Place 1 drop into both eyes 2 (two) times daily.     hydrochlorothiazide (HYDRODIURIL) 25 MG tablet Take 25 mg by mouth daily.     Multiple Vitamins-Minerals (MULTIVITAMIN WITH MINERALS) tablet Take 1 tablet by mouth daily.     olmesartan (BENICAR) 40 MG tablet Take 40 mg by mouth daily.     vitamin B-12 (CYANOCOBALAMIN) 1000 MCG tablet Take 1,000 mcg by mouth daily.     No current facility-administered medications on file prior to visit.    There are no Patient Instructions on file for this visit. No follow-ups on file.   Kris Hartmann, NP

## 2022-10-24 LAB — VAS US ABI WITH/WO TBI
Left ABI: 1.1
Right ABI: 0.73

## 2023-01-17 ENCOUNTER — Other Ambulatory Visit (INDEPENDENT_AMBULATORY_CARE_PROVIDER_SITE_OTHER): Payer: Self-pay | Admitting: Nurse Practitioner

## 2023-01-17 DIAGNOSIS — I739 Peripheral vascular disease, unspecified: Secondary | ICD-10-CM

## 2023-01-20 ENCOUNTER — Encounter (INDEPENDENT_AMBULATORY_CARE_PROVIDER_SITE_OTHER): Payer: Self-pay | Admitting: Nurse Practitioner

## 2023-01-20 ENCOUNTER — Telehealth (INDEPENDENT_AMBULATORY_CARE_PROVIDER_SITE_OTHER): Payer: Self-pay

## 2023-01-20 ENCOUNTER — Encounter (INDEPENDENT_AMBULATORY_CARE_PROVIDER_SITE_OTHER): Payer: Self-pay

## 2023-01-20 ENCOUNTER — Ambulatory Visit (INDEPENDENT_AMBULATORY_CARE_PROVIDER_SITE_OTHER): Payer: Medicare Other

## 2023-01-20 ENCOUNTER — Ambulatory Visit (INDEPENDENT_AMBULATORY_CARE_PROVIDER_SITE_OTHER): Payer: Medicare Other | Admitting: Nurse Practitioner

## 2023-01-20 VITALS — BP 101/68 | HR 86 | Resp 17

## 2023-01-20 DIAGNOSIS — I739 Peripheral vascular disease, unspecified: Secondary | ICD-10-CM

## 2023-01-20 DIAGNOSIS — F03C0 Unspecified dementia, severe, without behavioral disturbance, psychotic disturbance, mood disturbance, and anxiety: Secondary | ICD-10-CM

## 2023-01-20 DIAGNOSIS — I10 Essential (primary) hypertension: Secondary | ICD-10-CM | POA: Diagnosis not present

## 2023-01-20 DIAGNOSIS — Z9889 Other specified postprocedural states: Secondary | ICD-10-CM | POA: Diagnosis not present

## 2023-01-20 DIAGNOSIS — I70262 Atherosclerosis of native arteries of extremities with gangrene, left leg: Secondary | ICD-10-CM | POA: Diagnosis not present

## 2023-01-20 NOTE — H&P (View-Only) (Signed)
 Subjective:    Patient ID: Erin Greer, female    DOB: 09/23/1927, 89 y.o.   MRN: [id number] Chief Complaint  Patient presents with   Follow-up    The patient returns to the office for followup and review of the noninvasive studies.  The patient has severe dementia and is largely nonverbal.  She has 3 daughters present today which helped provide much of her history.  The patient does not walk and so that she has not had claudication but the daughters note that she began to have discoloration of her foot sometime in May following a recent podiatry visit.  While the patient does not necessarily endorse having pain she is making painful movements such as pulling her left foot away when touched.  It is notable today that was trying to remove the patient's shoe and sock that touch to the foot is painful for her as indicated by grimacing and trying to pull away.  She does not have these issues when manipulating her right foot.  She previously had gangrenous changes of the left foot that had healed but today it is notably discolored.  There have been no significant changes to the patient's overall health care.  The patient denies amaurosis fugax or recent TIA symptoms. There are no recent neurological changes noted. There is no history of DVT, PE or superficial thrombophlebitis. The patient denies recent episodes of angina or shortness of breath.   Today noninvasive studies show occlusion of the previously placed stents in the SFA and popliteal vessels with scant flow through the tibial vessels    Review of Systems  Skin:  Positive for color change.  Neurological:  Positive for weakness.  All other systems reviewed and are negative.      Objective:   Physical Exam Vitals reviewed.  HENT:     Head: Normocephalic.  Cardiovascular:     Rate and Rhythm: Normal rate and regular rhythm.     Pulses:          Radial pulses are 1+ on the right side and 1+ on the left side.   Pulmonary:     Effort: Pulmonary effort is normal.  Skin:    General: Skin is dry.     Coloration: Skin is cyanotic.  Neurological:     Mental Status: She is oriented to person, place, and time and easily aroused.  Psychiatric:        Attention and Perception: She is inattentive.        Mood and Affect: Mood normal.        Behavior: Behavior normal.        Thought Content: Thought content normal.        Cognition and Memory: Cognition is impaired. Memory is impaired.        Judgment: Judgment normal.     BP 101/68 (BP Location: Left Arm)   Pulse 86   Resp 17   Past Medical History:  Diagnosis Date   Dementia (HCC)    High cholesterol    Hypertension    Stroke (HCC)     Social History   Socioeconomic History   Marital status: Married    Spouse name: Not on file   Number of children: Not on file   Years of education: Not on file   Highest education level: Not on file  Occupational History   Not on file  Tobacco Use   Smoking status: Former    Types: Cigarettes   Smokeless tobacco: Never    Vaping Use   Vaping Use: Never used  Substance and Sexual Activity   Alcohol use: No   Drug use: No   Sexual activity: Not Currently  Other Topics Concern   Not on file  Social History Narrative   Not on file   Social Determinants of Health   Financial Resource Strain: Not on file  Food Insecurity: Not on file  Transportation Needs: Not on file  Physical Activity: Not on file  Stress: Not on file  Social Connections: Not on file  Intimate Partner Violence: Not on file    Past Surgical History:  Procedure Laterality Date   ABDOMINAL HYSTERECTOMY     LOWER EXTREMITY ANGIOGRAPHY Left 09/26/2022   Procedure: Lower Extremity Angiography;  Surgeon: Dew, Jason S, MD;  Location: [facility];  Service: Cardiovascular;  Laterality: Left;   PERIPHERAL VASCULAR CATHETERIZATION N/A 10/13/2015   Procedure: Abdominal Aortogram w/Lower Extremity;  Surgeon: Gregory G  Schnier, MD;  Location: [facility];  Service: Cardiovascular;  Laterality: N/A;   PERIPHERAL VASCULAR CATHETERIZATION  10/13/2015   Procedure: Lower Extremity Intervention;  Surgeon: Gregory G Schnier, MD;  Location: [facility];  Service: Cardiovascular;;    Family History  Problem Relation Age of Onset   Breast cancer Neg Hx     No Known Allergies     Latest Ref Rng & Units 05/04/2022   11:06 AM 10/17/2020   11:50 PM 05/10/2020    4:40 PM  CBC  WBC 4.0 - 10.5 K/uL 5.3  5.9  5.3   Hemoglobin 12.0 - 15.0 g/dL 11.4  12.7  12.2   Hematocrit 36.0 - 46.0 % 36.4  38.5  37.9   Platelets 150 - 400 K/uL 292  269  216       CMP     Component Value Date/Time   NA 142 05/04/2022 1106   NA 141 01/26/2013 1210   K 4.7 05/04/2022 1106   K 4.1 01/26/2013 1210   CL 106 05/04/2022 1106   CL 100 01/26/2013 1210   CO2 29 05/04/2022 1106   CO2 33 (H) 01/26/2013 1210   GLUCOSE 115 (H) 05/04/2022 1106   GLUCOSE 141 (H) 01/26/2013 1210   BUN 24 (H) 09/26/2022 1017   BUN 11 01/26/2013 1210   CREATININE 0.68 09/26/2022 1017   CREATININE 0.82 01/26/2013 1210   CALCIUM 10.5 (H) 05/04/2022 1106   CALCIUM 11.2 (H) 01/26/2013 1210   PROT 7.6 10/17/2020 2350   PROT 8.7 (H) 01/26/2013 1210   ALBUMIN 4.5 10/17/2020 2350   ALBUMIN 4.6 01/26/2013 1210   AST 19 10/17/2020 2350   AST 24 01/26/2013 1210   ALT 18 10/17/2020 2350   ALT 22 01/26/2013 1210   ALKPHOS 69 10/17/2020 2350   ALKPHOS 77 01/26/2013 1210   BILITOT 0.8 10/17/2020 2350   BILITOT 0.5 01/26/2013 1210   GFRNONAA >60 09/26/2022 1017   GFRNONAA >60 01/26/2013 1210     No results found.     Assessment & Plan:   1. Atherosclerosis of native artery of left lower extremity with gangrene (HCC) Recommend:  The patient has evidence of severe atherosclerotic changes of both lower extremities with rest pain that is associated with preulcerative changes and impending tissue loss of the left foot.  This represents  a limb threatening ischemia and places the patient at the risk for left limb loss.  Patient should undergo angiography of the left lower extremity with the hope for intervention for limb salvage.    The risks and benefits as well as the alternative therapies was discussed in detail with the patient.  All questions were answered.  Patient agrees to proceed with left lower extremity angiography.  The patient will follow up with me in the office after the procedure.      2. Primary hypertension Continue antihypertensive medications as already ordered, these medications have been reviewed and there are no changes at this time.  3. Severe dementia, unspecified dementia type, unspecified whether behavioral, psychotic, or mood disturbance or anxiety (HCC) Mixed with patient's history very difficult but she has numerous family members who are able to elicit much of her history.   Current Outpatient Medications on File Prior to Visit  Medication Sig Dispense Refill   acetaminophen (TYLENOL) 325 MG tablet Take 1-2 tablets (325-650 mg total) by mouth every 4 (four) hours as needed for mild pain (or temp >/= 101 F).     amLODipine (NORVASC) 5 MG tablet Take 5 mg by mouth daily.     ASPIRIN 81 81 MG EC tablet Take 81 mg by mouth daily.     brimonidine (ALPHAGAN) 0.2 % ophthalmic solution Place 1 drop into both eyes 2 (two) times daily.     clopidogrel (PLAVIX) 75 MG tablet Take 75 mg by mouth daily.     dorzolamide (TRUSOPT) 2 % ophthalmic solution Place 1 drop into both eyes 2 (two) times daily.     hydrochlorothiazide (HYDRODIURIL) 25 MG tablet Take 25 mg by mouth daily.     olmesartan (BENICAR) 40 MG tablet Take 40 mg by mouth daily.     vitamin B-12 (CYANOCOBALAMIN) 1000 MCG tablet Take 1,000 mcg by mouth daily.     No current facility-administered medications on file prior to visit.    There are no Patient Instructions on file for this visit. No follow-ups on file.   Ashonti Leandro E Evola Hollis, NP   

## 2023-01-20 NOTE — Progress Notes (Signed)
Subjective:    Patient ID: Erin Greer, female    DOB: 1928-03-20, 89 y.o.   MRN: [id number] Chief Complaint  Patient presents with   Follow-up    The patient returns to the office for followup and review of the noninvasive studies.  The patient has severe dementia and is largely nonverbal.  She has 3 daughters present today which helped provide much of her history.  The patient does not walk and so that she has not had claudication but the daughters note that she began to have discoloration of her foot sometime in May following a recent podiatry visit.  While the patient does not necessarily endorse having pain she is making painful movements such as pulling her left foot away when touched.  It is notable today that was trying to remove the patient's shoe and sock that touch to the foot is painful for her as indicated by grimacing and trying to pull away.  She does not have these issues when manipulating her right foot.  She previously had gangrenous changes of the left foot that had healed but today it is notably discolored.  There have been no significant changes to the patient's overall health care.  The patient denies amaurosis fugax or recent TIA symptoms. There are no recent neurological changes noted. There is no history of DVT, PE or superficial thrombophlebitis. The patient denies recent episodes of angina or shortness of breath.   Today noninvasive studies show occlusion of the previously placed stents in the SFA and popliteal vessels with scant flow through the tibial vessels    Review of Systems  Skin:  Positive for color change.  Neurological:  Positive for weakness.  All other systems reviewed and are negative.      Objective:   Physical Exam Vitals reviewed.  HENT:     Head: Normocephalic.  Cardiovascular:     Rate and Rhythm: Normal rate and regular rhythm.     Pulses:          Radial pulses are 1+ on the right side and 1+ on the left side.   Pulmonary:     Effort: Pulmonary effort is normal.  Skin:    General: Skin is dry.     Coloration: Skin is cyanotic.  Neurological:     Mental Status: She is oriented to person, place, and time and easily aroused.  Psychiatric:        Attention and Perception: She is inattentive.        Mood and Affect: Mood normal.        Behavior: Behavior normal.        Thought Content: Thought content normal.        Cognition and Memory: Cognition is impaired. Memory is impaired.        Judgment: Judgment normal.     BP 101/68 (BP Location: Left Arm)   Pulse 86   Resp 17   Past Medical History:  Diagnosis Date   Dementia (HCC)    High cholesterol    Hypertension    Stroke [facility])     Social History   Socioeconomic History   Marital status: Married    Spouse name: Not on file   Number of children: Not on file   Years of education: Not on file   Highest education level: Not on file  Occupational History   Not on file  Tobacco Use   Smoking status: Former    Types: Cigarettes   Smokeless tobacco: Never  Vaping Use   Vaping Use: Never used  Substance and Sexual Activity   Alcohol use: No   Drug use: No   Sexual activity: Not Currently  Other Topics Concern   Not on file  Social History Narrative   Not on file   Social Determinants of Health   Financial Resource Strain: Not on file  Food Insecurity: Not on file  Transportation Needs: Not on file  Physical Activity: Not on file  Stress: Not on file  Social Connections: Not on file  Intimate Partner Violence: Not on file    Past Surgical History:  Procedure Laterality Date   ABDOMINAL HYSTERECTOMY     LOWER EXTREMITY ANGIOGRAPHY Left 09/26/2022   Procedure: Lower Extremity Angiography;  Surgeon: Annice Needy, MD;  Location: [facility];  Service: Cardiovascular;  Laterality: Left;   PERIPHERAL VASCULAR CATHETERIZATION N/A 10/13/2015   Procedure: Abdominal Aortogram w/Lower Extremity;  Surgeon: Renford Dills, MD;  Location: [facility];  Service: Cardiovascular;  Laterality: N/A;   PERIPHERAL VASCULAR CATHETERIZATION  10/13/2015   Procedure: Lower Extremity Intervention;  Surgeon: Renford Dills, MD;  Location: [facility];  Service: Cardiovascular;;    Family History  Problem Relation Age of Onset   Breast cancer Neg Hx     No Known Allergies     Latest Ref Rng & Units 05/04/2022   11:06 AM 10/17/2020   11:50 PM 05/10/2020    4:40 PM  CBC  WBC 4.0 - 10.5 K/uL 5.3  5.9  5.3   Hemoglobin 12.0 - 15.0 g/dL 16.1  09.6  04.5   Hematocrit 36.0 - 46.0 % 36.4  38.5  37.9   Platelets 150 - 400 K/uL 292  269  216       CMP     Component Value Date/Time   NA 142 05/04/2022 1106   NA 141 01/26/2013 1210   K 4.7 05/04/2022 1106   K 4.1 01/26/2013 1210   CL 106 05/04/2022 1106   CL 100 01/26/2013 1210   CO2 29 05/04/2022 1106   CO2 33 (H) 01/26/2013 1210   GLUCOSE 115 (H) 05/04/2022 1106   GLUCOSE 141 (H) 01/26/2013 1210   BUN 24 (H) 09/26/2022 1017   BUN 11 01/26/2013 1210   CREATININE 0.68 09/26/2022 1017   CREATININE 0.82 01/26/2013 1210   CALCIUM 10.5 (H) 05/04/2022 1106   CALCIUM 11.2 (H) 01/26/2013 1210   PROT 7.6 10/17/2020 2350   PROT 8.7 (H) 01/26/2013 1210   ALBUMIN 4.5 10/17/2020 2350   ALBUMIN 4.6 01/26/2013 1210   AST 19 10/17/2020 2350   AST 24 01/26/2013 1210   ALT 18 10/17/2020 2350   ALT 22 01/26/2013 1210   ALKPHOS 69 10/17/2020 2350   ALKPHOS 77 01/26/2013 1210   BILITOT 0.8 10/17/2020 2350   BILITOT 0.5 01/26/2013 1210   GFRNONAA >60 09/26/2022 1017   GFRNONAA >60 01/26/2013 1210     No results found.     Assessment & Plan:   1. Atherosclerosis of native artery of left lower extremity with gangrene (HCC) Recommend:  The patient has evidence of severe atherosclerotic changes of both lower extremities with rest pain that is associated with preulcerative changes and impending tissue loss of the left foot.  This represents  a limb threatening ischemia and places the patient at the risk for left limb loss.  Patient should undergo angiography of the left lower extremity with the hope for intervention for limb salvage.  The risks and benefits as well as the alternative therapies was discussed in detail with the patient.  All questions were answered.  Patient agrees to proceed with left lower extremity angiography.  The patient will follow up with me in the office after the procedure.      2. Primary hypertension Continue antihypertensive medications as already ordered, these medications have been reviewed and there are no changes at this time.  3. Severe dementia, unspecified dementia type, unspecified whether behavioral, psychotic, or mood disturbance or anxiety (HCC) Mixed with patient's history very difficult but she has numerous family members who are able to elicit much of her history.   Current Outpatient Medications on File Prior to Visit  Medication Sig Dispense Refill   acetaminophen (TYLENOL) 325 MG tablet Take 1-2 tablets (325-650 mg total) by mouth every 4 (four) hours as needed for mild pain (or temp >/= 101 F).     amLODipine (NORVASC) 5 MG tablet Take 5 mg by mouth daily.     ASPIRIN 81 81 MG EC tablet Take 81 mg by mouth daily.     brimonidine (ALPHAGAN) 0.2 % ophthalmic solution Place 1 drop into both eyes 2 (two) times daily.     clopidogrel (PLAVIX) 75 MG tablet Take 75 mg by mouth daily.     dorzolamide (TRUSOPT) 2 % ophthalmic solution Place 1 drop into both eyes 2 (two) times daily.     hydrochlorothiazide (HYDRODIURIL) 25 MG tablet Take 25 mg by mouth daily.     olmesartan (BENICAR) 40 MG tablet Take 40 mg by mouth daily.     vitamin B-12 (CYANOCOBALAMIN) 1000 MCG tablet Take 1,000 mcg by mouth daily.     No current facility-administered medications on file prior to visit.    There are no Patient Instructions on file for this visit. No follow-ups on file.   Georgiana Spinner, NP

## 2023-01-20 NOTE — Telephone Encounter (Signed)
Patient was seen today and scheduled for a LLE angio with Dr. Wyn Quaker on 01/23/23 with a 10:30 am arrival time to the [facility]. Pre-procedure instructions were discussed and handed to the family.

## 2023-01-23 ENCOUNTER — Other Ambulatory Visit: Payer: Self-pay

## 2023-01-23 ENCOUNTER — Encounter: Payer: Self-pay | Admitting: Vascular Surgery

## 2023-01-23 ENCOUNTER — Encounter
Admission: RE | Disposition: A | Discharge status: Home / Self Care | Payer: Self-pay | Source: Home / Self Care | Attending: Vascular Surgery

## 2023-01-23 ENCOUNTER — Observation Stay
Admission: RE | Admit: 2023-01-23 | Discharge: 2023-01-24 | Disposition: A | Discharge status: Home / Self Care | Payer: Medicare Other | Attending: Vascular Surgery | Admitting: Vascular Surgery

## 2023-01-23 DIAGNOSIS — I10 Essential (primary) hypertension: Secondary | ICD-10-CM | POA: Diagnosis not present

## 2023-01-23 DIAGNOSIS — Z7982 Long term (current) use of aspirin: Secondary | ICD-10-CM | POA: Insufficient documentation

## 2023-01-23 DIAGNOSIS — I70222 Atherosclerosis of native arteries of extremities with rest pain, left leg: Principal | ICD-10-CM

## 2023-01-23 DIAGNOSIS — I743 Embolism and thrombosis of arteries of the lower extremities: Secondary | ICD-10-CM | POA: Diagnosis not present

## 2023-01-23 DIAGNOSIS — Z87891 Personal history of nicotine dependence: Secondary | ICD-10-CM | POA: Diagnosis not present

## 2023-01-23 DIAGNOSIS — Z9889 Other specified postprocedural states: Secondary | ICD-10-CM | POA: Diagnosis not present

## 2023-01-23 DIAGNOSIS — Z8673 Personal history of transient ischemic attack (TIA), and cerebral infarction without residual deficits: Secondary | ICD-10-CM | POA: Insufficient documentation

## 2023-01-23 DIAGNOSIS — F0390 Unspecified dementia without behavioral disturbance: Secondary | ICD-10-CM | POA: Diagnosis not present

## 2023-01-23 DIAGNOSIS — T82868A Thrombosis of vascular prosthetic devices, implants and grafts, initial encounter: Secondary | ICD-10-CM

## 2023-01-23 DIAGNOSIS — I77819 Aortic ectasia, unspecified site: Secondary | ICD-10-CM | POA: Diagnosis not present

## 2023-01-23 DIAGNOSIS — Z79899 Other long term (current) drug therapy: Secondary | ICD-10-CM | POA: Insufficient documentation

## 2023-01-23 DIAGNOSIS — I70229 Atherosclerosis of native arteries of extremities with rest pain, unspecified extremity: Principal | ICD-10-CM

## 2023-01-23 HISTORY — DX: Peripheral vascular disease, unspecified: I73.9

## 2023-01-23 HISTORY — PX: LOWER EXTREMITY ANGIOGRAPHY: CATH118251

## 2023-01-23 LAB — CREATININE, SERUM
Creatinine, Ser: 0.85 mg/dL (ref 0.44–1.00)
GFR, Estimated: >60 mL/min (ref >60)

## 2023-01-23 LAB — BUN: BUN: 35 mg/dL — ABNORMAL HIGH (ref 8–23)

## 2023-01-23 SURGERY — LOWER EXTREMITY ANGIOGRAPHY
Anesthesia: Moderate Sedation | Laterality: Left

## 2023-01-23 MED ORDER — ASPIRIN 81 MG PO TBEC
81.0000 mg | DELAYED_RELEASE_TABLET | Freq: Every day | ORAL | Status: DC
  Administered 2023-01-23 – 2023-01-24 (×2): 81 mg via ORAL
  Filled 2023-01-23: qty 1

## 2023-01-23 MED ORDER — FENTANYL CITRATE PF 50 MCG/ML IJ SOSY
PREFILLED_SYRINGE | INTRAMUSCULAR | Status: AC
  Filled 2023-01-23: qty 1

## 2023-01-23 MED ORDER — TIROFIBAN HCL IV 12.5 MG/250 ML
INTRAVENOUS | Status: AC
  Administered 2023-01-23: 0.075 ug/kg/min via INTRAVENOUS
  Filled 2023-01-23: qty 250

## 2023-01-23 MED ORDER — MIDAZOLAM HCL 2 MG/2ML IJ SOLN
INTRAMUSCULAR | Status: AC
  Filled 2023-01-23: qty 2

## 2023-01-23 MED ORDER — CLOPIDOGREL BISULFATE 75 MG PO TABS
75.0000 mg | ORAL_TABLET | Freq: Every day | ORAL | Status: DC
  Administered 2023-01-23 – 2023-01-24 (×2): 75 mg via ORAL
  Filled 2023-01-23: qty 1

## 2023-01-23 MED ORDER — TRAMADOL HCL 50 MG PO TABS: 50.0000 mg | ORAL_TABLET | Freq: Four times a day (QID) | ORAL | Status: DC | PRN

## 2023-01-23 MED ORDER — METHYLPREDNISOLONE SODIUM SUCC 125 MG IJ SOLR: 125.0000 mg | Freq: Once | INTRAMUSCULAR | Status: DC | PRN

## 2023-01-23 MED ORDER — ACETAMINOPHEN 325 MG PO TABS: 325.0000 mg | ORAL_TABLET | ORAL | Status: DC | PRN

## 2023-01-23 MED ORDER — PHENOL 1.4 % MT LIQD: 1.0000 | OROMUCOSAL | Status: DC | PRN

## 2023-01-23 MED ORDER — FAMOTIDINE 20 MG PO TABS: 40.0000 mg | ORAL_TABLET | Freq: Once | ORAL | Status: DC | PRN

## 2023-01-23 MED ORDER — CEFAZOLIN SODIUM-DEXTROSE 2-4 GM/100ML-% IV SOLN
2.0000 g | INTRAVENOUS | Status: AC
  Administered 2023-01-23: 2 g via INTRAVENOUS

## 2023-01-23 MED ORDER — HEPARIN (PORCINE) IN NACL 1000-0.9 UT/500ML-% IV SOLN
INTRAVENOUS | Status: DC | PRN
  Administered 2023-01-23: 1000 mL

## 2023-01-23 MED ORDER — ONDANSETRON HCL 4 MG/2ML IJ SOLN: 4.0000 mg | Freq: Four times a day (QID) | INTRAMUSCULAR | Status: DC | PRN

## 2023-01-23 MED ORDER — ASPIRIN 81 MG PO TBEC
DELAYED_RELEASE_TABLET | ORAL | Status: AC
  Filled 2023-01-23: qty 1

## 2023-01-23 MED ORDER — MIDAZOLAM HCL 2 MG/ML PO SYRP: 8.0000 mg | ORAL_SOLUTION | Freq: Once | ORAL | Status: DC | PRN

## 2023-01-23 MED ORDER — CEFAZOLIN SODIUM-DEXTROSE 2-4 GM/100ML-% IV SOLN
INTRAVENOUS | Status: AC
  Filled 2023-01-23: qty 100

## 2023-01-23 MED ORDER — ORAL CARE MOUTH RINSE
15.0000 mL | OROMUCOSAL | Status: DC
  Administered 2023-01-24 (×2): 15 mL via OROMUCOSAL

## 2023-01-23 MED ORDER — HEPARIN SODIUM (PORCINE) 1000 UNIT/ML IJ SOLN
INTRAMUSCULAR | Status: AC
  Filled 2023-01-23: qty 10

## 2023-01-23 MED ORDER — LABETALOL HCL 5 MG/ML IV SOLN: 10.0000 mg | INTRAVENOUS | Status: DC | PRN

## 2023-01-23 MED ORDER — HYDROMORPHONE HCL 1 MG/ML IJ SOLN: 1.0000 mg | Freq: Once | INTRAMUSCULAR | Status: DC | PRN

## 2023-01-23 MED ORDER — TIROFIBAN HCL IN NACL 5-0.9 MG/100ML-% IV SOLN
0.0750 ug/kg/min | INTRAVENOUS | Status: DC
  Filled 2023-01-23: qty 100

## 2023-01-23 MED ORDER — HEPARIN SODIUM (PORCINE) 1000 UNIT/ML IJ SOLN
INTRAMUSCULAR | Status: DC | PRN
  Administered 2023-01-23: 4000 [IU] via INTRAVENOUS

## 2023-01-23 MED ORDER — GUAIFENESIN-DM 100-10 MG/5ML PO SYRP: 15.0000 mL | ORAL_SOLUTION | ORAL | Status: DC | PRN

## 2023-01-23 MED ORDER — AMLODIPINE BESYLATE 5 MG PO TABS
ORAL_TABLET | ORAL | Status: AC
  Filled 2023-01-23: qty 1

## 2023-01-23 MED ORDER — POTASSIUM CHLORIDE CRYS ER 20 MEQ PO TBCR: 20.0000 meq | EXTENDED_RELEASE_TABLET | ORAL | Status: DC | PRN

## 2023-01-23 MED ORDER — SODIUM CHLORIDE 0.9 % IV SOLN: INTRAVENOUS | Status: DC

## 2023-01-23 MED ORDER — IRBESARTAN 150 MG PO TABS
300.0000 mg | ORAL_TABLET | Freq: Every day | ORAL | Status: DC
  Administered 2023-01-24: 300 mg via ORAL
  Filled 2023-01-23: qty 2

## 2023-01-23 MED ORDER — DIPHENHYDRAMINE HCL 50 MG/ML IJ SOLN: 50.0000 mg | Freq: Once | INTRAMUSCULAR | Status: DC | PRN

## 2023-01-23 MED ORDER — PANTOPRAZOLE SODIUM 40 MG PO TBEC
40.0000 mg | DELAYED_RELEASE_TABLET | Freq: Every day | ORAL | Status: DC
  Administered 2023-01-24: 40 mg via ORAL
  Filled 2023-01-23: qty 1

## 2023-01-23 MED ORDER — ACETAMINOPHEN 650 MG RE SUPP: 325.0000 mg | RECTAL | Status: DC | PRN

## 2023-01-23 MED ORDER — ATORVASTATIN CALCIUM 10 MG PO TABS
10.0000 mg | ORAL_TABLET | Freq: Every day | ORAL | Status: DC
  Filled 2023-01-23: qty 1

## 2023-01-23 MED ORDER — HYDROCHLOROTHIAZIDE 25 MG PO TABS
25.0000 mg | ORAL_TABLET | Freq: Every day | ORAL | Status: DC
  Administered 2023-01-24: 25 mg via ORAL
  Filled 2023-01-23: qty 1

## 2023-01-23 MED ORDER — IODIXANOL 320 MG/ML IV SOLN
INTRAVENOUS | Status: DC | PRN
  Administered 2023-01-23: 100 mL via INTRA_ARTERIAL
  Administered 2023-01-23: 50 mL via INTRA_ARTERIAL

## 2023-01-23 MED ORDER — CLOPIDOGREL BISULFATE 75 MG PO TABS
ORAL_TABLET | ORAL | Status: AC
  Filled 2023-01-23: qty 1

## 2023-01-23 MED ORDER — LIDOCAINE-EPINEPHRINE (PF) 1 %-1:200000 IJ SOLN
INTRAMUSCULAR | Status: DC | PRN
  Administered 2023-01-23: 10 mL

## 2023-01-23 MED ORDER — BRIMONIDINE TARTRATE 0.2 % OP SOLN
1.0000 [drp] | Freq: Two times a day (BID) | OPHTHALMIC | Status: DC
  Administered 2023-01-24: 1 [drp] via OPHTHALMIC
  Filled 2023-01-23: qty 5

## 2023-01-23 MED ORDER — ALUM & MAG HYDROXIDE-SIMETH 200-200-20 MG/5ML PO SUSP: 15.0000 mL | ORAL | Status: DC | PRN

## 2023-01-23 MED ORDER — HYDRALAZINE HCL 20 MG/ML IJ SOLN: 5.0000 mg | INTRAMUSCULAR | Status: DC | PRN

## 2023-01-23 MED ORDER — VITAMIN B-12 1000 MCG PO TABS
1000.0000 ug | ORAL_TABLET | Freq: Every day | ORAL | Status: DC
  Administered 2023-01-24: 1000 ug via ORAL
  Filled 2023-01-23: qty 1

## 2023-01-23 MED ORDER — MAGNESIUM HYDROXIDE 400 MG/5ML PO SUSP: 30.0000 mL | Freq: Every day | ORAL | Status: DC | PRN

## 2023-01-23 MED ORDER — DORZOLAMIDE HCL 2 % OP SOLN
1.0000 [drp] | Freq: Two times a day (BID) | OPHTHALMIC | Status: DC
  Administered 2023-01-24: 1 [drp] via OPHTHALMIC
  Filled 2023-01-23: qty 10

## 2023-01-23 MED ORDER — FENTANYL CITRATE (PF) 100 MCG/2ML IJ SOLN
INTRAMUSCULAR | Status: DC | PRN
  Administered 2023-01-23: 50 ug via INTRAVENOUS
  Administered 2023-01-23: 25 ug via INTRAVENOUS

## 2023-01-23 MED ORDER — MIDAZOLAM HCL 2 MG/2ML IJ SOLN
INTRAMUSCULAR | Status: DC | PRN
  Administered 2023-01-23 (×2): 1 mg via INTRAVENOUS

## 2023-01-23 MED ORDER — AMLODIPINE BESYLATE 5 MG PO TABS
5.0000 mg | ORAL_TABLET | Freq: Every day | ORAL | Status: DC
  Administered 2023-01-23 – 2023-01-24 (×2): 5 mg via ORAL
  Filled 2023-01-23: qty 1

## 2023-01-23 MED ORDER — METOPROLOL TARTRATE 5 MG/5ML IV SOLN: 2.0000 mg | INTRAVENOUS | Status: DC | PRN

## 2023-01-23 MED ORDER — ORAL CARE MOUTH RINSE: 15.0000 mL | OROMUCOSAL | Status: DC | PRN

## 2023-01-23 SURGICAL SUPPLY — 26 items
APL PRP STRL LF DISP 70% ISPRP (MISCELLANEOUS) ×1
BALLN LUTONIX 018 5X100X130 (BALLOONS) ×1
BALLN ULTRVRSE 018 2.5X100X150 (BALLOONS) ×1
BALLN ULTRVRSE 3X100X150 (BALLOONS) ×1
BALLOON LUTONIX 018 5X100X130 (BALLOONS) IMPLANT
BALLOON ULTRVRSE 3X100X150 (BALLOONS) IMPLANT
BALLOON ULTRVS 018 2.5X100X150 (BALLOONS) IMPLANT
CANISTER PENUMBRA ENGINE (MISCELLANEOUS) IMPLANT
CATH ANGIO 5F PIGTAIL 65CM (CATHETERS) IMPLANT
CATH BEACON 5 .038 100 VERT TP (CATHETERS) IMPLANT
CATH LIGHTNING BOLT 7 130 (CATHETERS) IMPLANT
CHLORAPREP W/TINT 26 (MISCELLANEOUS) IMPLANT
COVER PROBE ULTRASOUND 5X96 (MISCELLANEOUS) IMPLANT
DEVICE STARCLOSE SE CLOSURE (Vascular Products) IMPLANT
GLIDEWIRE ADV .035X260CM (WIRE) IMPLANT
GOWN STRL REUS W/TWL 2XL LVL3 (GOWN DISPOSABLE) IMPLANT
IV NS 1000ML (IV SOLUTION) ×1
IV NS 1000ML BAXH (IV SOLUTION) IMPLANT
KIT ENCORE 26 ADVANTAGE (KITS) IMPLANT
PACK ANGIOGRAPHY (CUSTOM PROCEDURE TRAY) ×1 IMPLANT
SHEATH BRITE TIP 5FRX11 (SHEATH) IMPLANT
SHEATH FLEXOR ANSEL2 7FRX45 (SHEATH) IMPLANT
SYR MEDRAD MARK 7 150ML (SYRINGE) IMPLANT
TUBING CONTRAST HIGH PRESS 72 (TUBING) IMPLANT
WIRE G V18X300CM (WIRE) IMPLANT
WIRE GUIDERIGHT .035X150 (WIRE) IMPLANT

## 2023-01-23 NOTE — Op Note (Signed)
[facility]  Percutaneous Study/Intervention Procedural Note   Date of Surgery: 01/23/2023  Surgeon(s):Brexlee Heberlein    Assistants:none  Pre-operative Diagnosis: PAD with rest pain left lower extremity, acute on chronic ischemia  Post-operative diagnosis:  Same  Procedure(s) Performed:             1.  Ultrasound guidance for vascular access right femoral artery             2.  Catheter placement into left common femoral artery from right femoral approach             3.  Aortogram and selective left lower extremity angiogram             4.  Mechanical thrombectomy of the left SFA, popliteal artery, tibioperoneal trunk, and proximal peroneal artery with the penumbra 7 flash device             5.  Percutaneous transluminal angioplasty of the proximal left SFA with 5 mm diameter by 10 cm length Lutonix drug-coated angioplasty balloon  6.  Percutaneous transluminal angioplasty of the left tibioperoneal trunk and proximal peroneal artery with 2.5 and 3 mm diameter by 10 cm length angioplasty balloons             7.  StarClose closure device right femoral artery  EBL: 150 cc  Contrast: 50 cc  Fluoro Time: 7.7 minutes  Moderate Conscious Sedation Time: approximately 40 minutes using 2 mg of Versed and 75 mcg of Fentanyl              Indications:  Patient is a 89 y.o.female with acute on chronic ischemia of the left lower extremity with rest pain symptoms. The patient has noninvasive study showing occlusion of her previous left SFA and popliteal interventions. The patient is brought in for angiography for further evaluation and potential treatment.  Due to the limb threatening nature of the situation, angiogram was performed for attempted limb salvage. The patient is aware that if the procedure fails, amputation would be expected.  The patient also understands that even with successful revascularization, amputation may still be required due to the severity of the situation.   Risks and benefits are discussed and informed consent is obtained.   Procedure:  The patient was identified and appropriate procedural time out was performed.  The patient was then placed supine on the table and prepped and draped in the usual sterile fashion. Moderate conscious sedation was administered during a face to face encounter with the patient throughout the procedure with my supervision of the RN administering medicines and monitoring the patient's vital signs, pulse oximetry, telemetry and mental status throughout from the start of the procedure until the patient was taken to the recovery room. Ultrasound was used to evaluate the right common femoral artery.  It was patent .  A digital ultrasound image was acquired.  A Seldinger needle was used to access the right common femoral artery under direct ultrasound guidance and a permanent image was performed.  A 0.035 J wire was advanced without resistance and a 5Fr sheath was placed.  Pigtail catheter was placed into the aorta and an AP aortogram was performed. This demonstrated normal renal arteries and a somewhat ectatic aorta without significant stenosis and iliac segments without significant stenosis including patency of the previously placed stents. I then crossed the aortic bifurcation and advanced to the left femoral head. Selective left lower extremity angiogram was then performed. This demonstrated flush occlusion of the left SFA  at the level of the previously placed stents at the origin of the SFA.  The profunda femoris artery was patent.  There was occlusion throughout the entire left SFA and popliteal arteries.  The tibioperoneal trunk and proximal peroneal artery were occluded below the previously placed stents.  There was reconstitution of the proximal to mid peroneal artery which was the only runoff distally. It was felt that it was in the patient's best interest to proceed with intervention after these images to avoid a second procedure and  a larger amount of contrast and fluoroscopy based off of the findings from the initial angiogram. The patient was systemically heparinized and a 7 [place] [employer] sheath was then placed over the [employer] wire. I then used a Kumpe catheter and the advantage wire to Kumpe catheter to easily cross the occlusion and confirmed intraluminal flow in the peroneal artery in the midsegment.  I then placed a V18 wire.  I began by performing mechanical thrombectomy with 3 passes with the penumbra 7 flash/bolt device.  This was run throughout the entirety of the left SFA and popliteal arteries and down into the tibioperoneal trunk and proximal peroneal artery.  After significant mechanical thrombectomy was performed, there was stenosis with a small amount of residual thrombus creating about a 60 to 70% stenosis in the proximal left SFA.  The remainder of the stents were patent down to the tibioperoneal trunk and proximal peroneal artery which had about a 75 to 80% stenosis.  I then proceeded with angioplasty.  The tibioperoneal trunk and proximal peroneal artery were treated with first a 2.5 mm diameter by 10 cm length angioplasty balloon inflated to 10 atm for 1 minute.  This was slightly undersized so a 3 mm diameter by 10 cm angioplasty balloon was inflated to 8 atm for 1 minute.  Completion imaging showed marked improvement with only about a 15 to 20% residual stenosis in the tibioperoneal trunk and proximal peroneal artery after angioplasty.  I then turned my attention to the proximal left SFA.  This area was treated with a 5 mm diameter by 10 cm length Lutonix drug-coated angioplasty balloon inflated to 10 atm for 1 minute.  Completion imaging showed marked improvement with only a 10% residual stenosis and no significant thrombus seen. I elected to terminate the procedure. The sheath was removed and StarClose closure device was deployed in the right femoral artery with excellent hemostatic result. The patient was  taken to the recovery room in stable condition having tolerated the procedure well.  Findings:               Aortogram:  This demonstrated normal renal arteries and a somewhat ectatic aorta without significant stenosis and iliac segments without significant stenosis including patency of the previously placed stents.             Left lower Extremity:  This demonstrated flush occlusion of the left SFA at the level of the previously placed stents at the origin of the SFA.  The profunda femoris artery was patent.  There was occlusion throughout the entire left SFA and popliteal arteries.  The tibioperoneal trunk and proximal peroneal artery were occluded below the previously placed stents.  There was reconstitution of the proximal to mid peroneal artery which was the only runoff distally   Disposition: Patient was taken to the recovery room in stable condition having tolerated the procedure well.  Complications: None  Festus Barren 01/23/2023 2:49 PM   This note was created with  [employer] transcription system. Any errors in dictation are purely unintentional.

## 2023-01-23 NOTE — Interval H&P Note (Signed)
History and Physical Interval Note:  01/23/2023 9:52 AM  Erin Greer  has presented today for surgery, with the diagnosis of LLE Angio   ASO w rest pain.  The various methods of treatment have been discussed with the patient and family. After consideration of risks, benefits and other options for treatment, the patient has consented to  Procedure(s): Lower Extremity Angiography (Left) as a surgical intervention.  The patient's history has been reviewed, patient examined, no change in status, stable for surgery.  I have reviewed the patient's chart and labs.  Questions were answered to the patient's satisfaction.     Festus Barren

## 2023-01-24 ENCOUNTER — Encounter: Payer: Self-pay | Admitting: Vascular Surgery

## 2023-01-24 DIAGNOSIS — I70222 Atherosclerosis of native arteries of extremities with rest pain, left leg: Secondary | ICD-10-CM | POA: Diagnosis not present

## 2023-01-24 MED ORDER — ATORVASTATIN CALCIUM 20 MG PO TABS: 40.0000 mg | ORAL_TABLET | Freq: Every day | ORAL | Status: DC

## 2023-01-24 MED ORDER — BOOST / RESOURCE BREEZE PO LIQD CUSTOM
1.0000 | Freq: Three times a day (TID) | ORAL | Status: DC
  Administered 2023-01-24: 1 via ORAL

## 2023-01-24 MED ORDER — ADULT MULTIVITAMIN W/MINERALS CH
1.0000 | ORAL_TABLET | Freq: Every day | ORAL | Status: DC
  Administered 2023-01-24: 1 via ORAL
  Filled 2023-01-24: qty 1

## 2023-01-24 NOTE — Progress Notes (Signed)
Initial Nutrition Assessment  DOCUMENTATION CODES:   Underweight, Severe malnutrition in context of chronic illness  INTERVENTION:   -Continue with regular diet -MVI with minerals daily -Boost Breeze po TID, each supplement provides 250 kcal and 9 grams of protein  -Feeding assistance with meals  NUTRITION DIAGNOSIS:   Severe Malnutrition related to chronic illness (dementia) as evidenced by moderate fat depletion, severe fat depletion, moderate muscle depletion, severe muscle depletion.  GOAL:   Patient will meet greater than or equal to 90% of their needs  MONITOR:   PO intake, Supplement acceptance  REASON FOR ASSESSMENT:   Low Braden    ASSESSMENT:   Pt with PMH of HTN, dementia, HTN, and PVD. 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   6/24- s/p lt lower extremity angiography  Reviewed I/O's: +337 ml x 24 hours  Pt sleeping soundly at time of visit. She did not respond to voice to touch.   History obtained by daughter at bedside. She explains that pt resides with her husband and another daughter, who provides 24 hour care for both of them. Pt typically has a good appetite but requires feeding assistance. PTA she consumed 3 meals per day (Breakfast: grits, oatmeal, and eggs; Lunch and Dinner: meat, starch, and vegetable). Pt typically eats well and "cleans her plate".   Pt able to eat a little bite of breakfast this morning, but had a rough night so daughter "didn't push it". She was able to eat a few bites of everything off of her meal tray.   Pt has always been small framed/ petite. Per daughter, her UBW is around 83# and she weighed 78# at her last MD appointment. Reviewed wt hx; pt has experienced a 8.2% wt loss over the past 8 months. While this is not significant for time frame, it is concerning given advanced age, malnutrition,  underweight status and dementia. Would not recommend artifical means of nutrition/ hydration in this pt as this is would not enhance pt's quality of life.   Discussed importance of good meal and supplement intake to promote healing. Pt has tried Ensure in the past, but does not like this. Pt amenable to [facility], as she tends to like juices.   Medications reviewed and include vitamin B-12.   Lab Results  Component Value Date   HGBA1C 6.4 (H) 05/10/2020   PTA DM medications are none.   Labs reviewed: CBGS: 102 (inpatient orders for glycemic control are none).    NUTRITION - FOCUSED PHYSICAL EXAM:  Flowsheet Row Most Recent Value  Orbital Region Mild depletion  Upper Arm Region Severe depletion  Thoracic and Lumbar Region Severe depletion  Buccal Region Mild depletion  Temple Region Moderate depletion  Clavicle Bone Region Severe depletion  Clavicle and Acromion Bone Region Severe depletion  Scapular Bone Region Severe depletion  Dorsal Hand Severe depletion  Patellar Region Severe depletion  Anterior Thigh Region Severe depletion  Posterior Calf Region Severe depletion  Edema (RD Assessment) None  Hair Reviewed  Eyes Reviewed  Mouth Reviewed  Skin Reviewed  Nails Reviewed       Diet Order:   Diet Order             Diet regular Room service appropriate? Yes; Fluid consistency: Thin  Diet effective now  EDUCATION NEEDS:   Education needs have been addressed  Skin:  Skin Assessment: Reviewed RN Assessment  Last BM:  01/23/23 (type 5)  Height:   Ht Readings from Last 1 Encounters:  01/23/23 5\' 1"  (1.549 m)    Weight:   Wt Readings from Last 1 Encounters:  01/23/23 34.5 kg    Ideal Body Weight:  47.7 kg  BMI:  Body mass index is 14.36 kg/m.  Estimated Nutritional Needs:   Kcal:  1100-1300  Protein:  45-60 grams  Fluid:  > 1 L    Levada Schilling, RD, LDN, CDCES Registered Dietitian II Certified Diabetes Care and Education  Specialist Please refer to [facility] for RD and/or RD on-call/weekend/after hours pager

## 2023-01-24 NOTE — Discharge Summary (Signed)
[facility]    Discharge Summary    Patient ID:  Erin Greer MRN: [id number] DOB/AGE: Jul 14, 1928 89 y.o.  Admit date: 01/23/2023 Discharge date: 01/24/2023 Date of Surgery: 01/23/2023 Surgeon: Surgeon(s): Annice Needy, MD  Admission Diagnosis: Atherosclerosis of artery of extremity with rest pain [facility]) [I70.229]  Discharge Diagnoses:  Atherosclerosis of artery of extremity with rest pain [facility]) [I70.229]  Secondary Diagnoses: Past Medical History:  Diagnosis Date   Dementia (HCC)    High cholesterol    Hypertension    PVD (peripheral vascular disease) with claudication (HCC)    Stroke (HCC)     Procedure(s): Lower Extremity Angiography  Discharged Condition: good  HPI:  Erin Greer is a 89 yo female now POD #1 from a Left lower extremity Angiogram with angioplasty and mechanical thrombectomy. She is recovering as expected. No complications post procedure to note. Vitals all remain stable. Patient discharged home on ASA 81 mg daily and Plavix 75 mg daily.   Hospital Course:  Erin Greer is a 89 y.o. female is S/P Left lower extremity Angiogram with angioplasty and mechanical thrombectomy.  Extubated: POD # 0 Physical Exam:  Alert notes x3, no acute distress Face: Symmetrical.  Tongue is midline. Neck: Trachea is midline.  No swelling or bruising. Cardiovascular: Regular rate and rhythm Pulmonary: Clear to auscultation bilaterally Abdomen: Soft, nontender, nondistended Right groin access: Clean dry and intact.  No swelling or drainage noted Left lower extremity: Thigh soft.  Calf soft.  Extremities warm distally toes.  Hard to palpate pedal pulses however the foot is warm is her good capillary refill. Right lower extremity: Thigh soft.  Calf soft.  Extremities warm distally toes.  Hard to palpate pedal pulses however the foot is warm is her good capillary refill. Neurological: No deficits noted   Post-op wounds:   clean, dry, intact or healing well  Pt. Ambulating, voiding and taking PO diet without difficulty. Pt pain controlled with PO pain meds.  Labs:  As below  Complications: none  Consults:    Significant Diagnostic Studies: CBC Lab Results  Component Value Date   WBC 5.3 05/04/2022   HGB 11.4 (L) 05/04/2022   HCT 36.4 05/04/2022   MCV 90.8 05/04/2022   PLT 292 05/04/2022    BMET    Component Value Date/Time   NA 142 05/04/2022 1106   NA 141 01/26/2013 1210   K 4.7 05/04/2022 1106   K 4.1 01/26/2013 1210   CL 106 05/04/2022 1106   CL 100 01/26/2013 1210   CO2 29 05/04/2022 1106   CO2 33 (H) 01/26/2013 1210   GLUCOSE 115 (H) 05/04/2022 1106   GLUCOSE 141 (H) 01/26/2013 1210   BUN 35 (H) 01/23/2023 1048   BUN 11 01/26/2013 1210   CREATININE 0.85 01/23/2023 1048   CREATININE 0.82 01/26/2013 1210   CALCIUM 10.5 (H) 05/04/2022 1106   CALCIUM 11.2 (H) 01/26/2013 1210   GFRNONAA >60 01/23/2023 1048   GFRNONAA >60 01/26/2013 1210   GFRAA >60 10/13/2015 0705   GFRAA >60 01/26/2013 1210   COAG Lab Results  Component Value Date   INR 0.9 05/10/2020     Disposition:  Discharge to :Home  Allergies as of 01/24/2023   No Known Allergies      Medication List     TAKE these medications    acetaminophen 325 MG tablet Commonly known as: TYLENOL Take 1-2 tablets (325-650 mg total) by mouth every 4 (four) hours as needed for mild  pain (or temp >/= 101 F).   amLODipine 5 MG tablet Commonly known as: NORVASC Take 5 mg by mouth daily.   Aspirin 81 81 MG tablet Generic drug: aspirin EC Take 81 mg by mouth daily.   brimonidine 0.2 % ophthalmic solution Commonly known as: ALPHAGAN Place 1 drop into both eyes 2 (two) times daily.   clopidogrel 75 MG tablet Commonly known as: PLAVIX Take 75 mg by mouth daily.   cyanocobalamin 1000 MCG tablet Commonly known as: VITAMIN B12 Take 1,000 mcg by mouth daily.   dorzolamide 2 % ophthalmic solution Commonly known  as: TRUSOPT Place 1 drop into both eyes 2 (two) times daily.   hydrochlorothiazide 25 MG tablet Commonly known as: HYDRODIURIL Take 25 mg by mouth daily.   olmesartan 40 MG tablet Commonly known as: BENICAR Take 40 mg by mouth daily.       Verbal and written Discharge instructions given to the patient. Wound care per Discharge AVS  Follow-up Information     Georgiana Spinner, NP Follow up in 1 month(s).   Specialty: Vascular Surgery Why: Left Lower extremity U/S with ABI Contact information: [address] [place] [address] [phone]                 Signed: Marcie Bal, NP  01/24/2023, 3:30 PM

## 2023-01-24 NOTE — Progress Notes (Signed)
Due to patient's dementia and AMS, she is unable to follow commands and keep her leg straight when sleeping at night. This RN noted frequent movements of the right leg including bending her knee and holding her leg up to her chest. As a result, she developed some oozing from the femoral site which was first noticed around shift change. [phone]). Pressure held until hemostasis was obtained. Pressure dressing with gauze and tagaderm placed. Dressing required changing and pressure again had to be held at 0545 due to subsequent oozing due to patient bending and moving right leg. Family requested if a knee immobilizer could be placed to prevent the patient from drawing her knees up to her chest and causing the femoral site to bleed again.   After hemostatis was obtained and a new dressing was placed, the patient's leg was straightened and a knee immobilizer was placed to assist with protection of the femoral access site and prevention of oozing and rebleeding.   No hematoma noted to site. Femoral pulse 2+, DP pulse 1+ on right leg. Leg is warm with good cap refill.   Left leg warm to touch, cap refill adequate. Pedal pulses 1+, femoral pulse 2+

## 2023-01-24 NOTE — TOC CM/SW Note (Signed)
Transition of Care [facility]) - Inpatient Brief Assessment   Patient Details  Name: Erin Greer MRN: [id number] Date of Birth: [birth year]  Transition of Care [facility]) CM/SW Contact:    Margarito Liner, LCSW Phone Number: 01/24/2023, 3:37 PM   Clinical Narrative: Patient has orders to discharge home today. Chart reviewed. No TOC needs identified. CSW signing off.  Transition of Care Asessment: Insurance and Status: Insurance coverage has been reviewed Patient has primary care physician: Yes Home environment has been reviewed: Single family home Prior level of function:: Not documented Prior/Current Home Services: No current home services Social Determinants of Health Reivew: SDOH reviewed no interventions necessary Readmission risk has been reviewed: Yes Transition of care needs: no transition of care needs at this time

## 2023-03-28 ENCOUNTER — Encounter (INDEPENDENT_AMBULATORY_CARE_PROVIDER_SITE_OTHER): Payer: Self-pay | Admitting: Nurse Practitioner

## 2023-03-28 ENCOUNTER — Ambulatory Visit (INDEPENDENT_AMBULATORY_CARE_PROVIDER_SITE_OTHER): Payer: Medicare Other

## 2023-03-28 ENCOUNTER — Other Ambulatory Visit (INDEPENDENT_AMBULATORY_CARE_PROVIDER_SITE_OTHER): Payer: Self-pay | Admitting: Nurse Practitioner

## 2023-03-28 ENCOUNTER — Ambulatory Visit (INDEPENDENT_AMBULATORY_CARE_PROVIDER_SITE_OTHER): Payer: Medicare Other | Admitting: Nurse Practitioner

## 2023-03-28 VITALS — BP 151/70 | HR 74 | Resp 16 | Ht 62.0 in | Wt 76.0 lb

## 2023-03-28 DIAGNOSIS — I70222 Atherosclerosis of native arteries of extremities with rest pain, left leg: Secondary | ICD-10-CM

## 2023-03-28 DIAGNOSIS — Z9889 Other specified postprocedural states: Secondary | ICD-10-CM

## 2023-03-28 DIAGNOSIS — I10 Essential (primary) hypertension: Secondary | ICD-10-CM

## 2023-03-28 DIAGNOSIS — I739 Peripheral vascular disease, unspecified: Secondary | ICD-10-CM

## 2023-03-28 NOTE — H&P (View-Only) (Signed)
Subjective:    Patient ID: Erin Greer, female    DOB: January 23, 1928, 89 y.o.   MRN: [id number] Chief Complaint  Patient presents with   Follow-up    1 month follow up with ABI    The patient presents today with her daughter.  The patient herself has severe dementia and is nonverbal and does not participate with the discussion today.  The patient recently had an angiogram approximately 1 month or so ago.  She underwent extensive intervention of her left lower extremity including the lobectomy of the left SFA, popliteal artery and tibioperoneal trunk.  The patient was in her usual state of health until was noted that recently with some positioning changes the patient's foot Became very cold.  There was no development of open wounds or ulcerations.  Physical exam of the patient's left lower extremity is very cold to touch.  The patient's daughter notes that she was concerned about this and contact her office for evaluation.  Today noninvasive studies show a left SFA and popliteal stents are occluded post thrombectomy.    Review of Systems  Neurological:  Positive for weakness.  All other systems reviewed and are negative.      Objective:   Physical Exam Vitals reviewed.  HENT:     Head: Normocephalic.  Cardiovascular:     Rate and Rhythm: Normal rate.  Pulmonary:     Effort: Pulmonary effort is normal.  Skin:    General: Skin is cool and dry.  Neurological:     Mental Status: She is alert and oriented to person, place, and time.  Psychiatric:        Mood and Affect: Mood normal.        Behavior: Behavior normal.        Thought Content: Thought content normal.        Judgment: Judgment normal.     BP (!) 151/70 (BP Location: Left Arm)   Pulse 74   Resp 16   Ht 5\' 2"  (1.575 m)   Wt 76 lb (34.5 kg)   BMI 13.90 kg/m   Past Medical History:  Diagnosis Date   Dementia (HCC)    High cholesterol    Hypertension    PVD (peripheral vascular disease) with  claudication (HCC)    Stroke (HCC)     Social History   Socioeconomic History   Marital status: Married    Spouse name: Not on file   Number of children: Not on file   Years of education: Not on file   Highest education level: Not on file  Occupational History   Not on file  Tobacco Use   Smoking status: Former    Types: Cigarettes   Smokeless tobacco: Never  Vaping Use   Vaping status: Never Used  Substance and Sexual Activity   Alcohol use: No   Drug use: No   Sexual activity: Not Currently  Other Topics Concern   Not on file  Social History Narrative   Not on file   Social Determinants of Health   Financial Resource Strain: Low Risk  (01/10/2023)   Received from [facility], [facility]   Overall Financial Resource Strain (CARDIA)    Difficulty of Paying Living Expenses: Not hard at all  Food Insecurity: No Food Insecurity (01/23/2023)   Hunger Vital Sign    Worried About Running Out of Food in the Last Year: Never true    Ran Out of Food in the Last  Year: Never true  Transportation Needs: No Transportation Needs (01/23/2023)   PRAPARE - Administrator, Civil Service (Medical): No    Lack of Transportation (Non-Medical): No  Physical Activity: Not on file  Stress: Not on file  Social Connections: Not on file  Intimate Partner Violence: Not At Risk (01/23/2023)   Humiliation, Afraid, Rape, and Kick questionnaire    Fear of Current or Ex-Partner: No    Emotionally Abused: No    Physically Abused: No    Sexually Abused: No    Past Surgical History:  Procedure Laterality Date   ABDOMINAL HYSTERECTOMY     LOWER EXTREMITY ANGIOGRAPHY Left 09/26/2022   Procedure: Lower Extremity Angiography;  Surgeon: Annice Needy, MD;  Location: [facility] INVASIVE CV LAB;  Service: Cardiovascular;  Laterality: Left;   LOWER EXTREMITY ANGIOGRAPHY Left 01/23/2023   Procedure: Lower Extremity Angiography;  Surgeon: Annice Needy, MD;  Location:  [facility] INVASIVE CV LAB;  Service: Cardiovascular;  Laterality: Left;   PERIPHERAL VASCULAR CATHETERIZATION N/A 10/13/2015   Procedure: Abdominal Aortogram w/Lower Extremity;  Surgeon: Renford Dills, MD;  Location: [facility] INVASIVE CV LAB;  Service: Cardiovascular;  Laterality: N/A;   PERIPHERAL VASCULAR CATHETERIZATION  10/13/2015   Procedure: Lower Extremity Intervention;  Surgeon: Renford Dills, MD;  Location: [facility] INVASIVE CV LAB;  Service: Cardiovascular;;    Family History  Problem Relation Age of Onset   Breast cancer Neg Hx     No Known Allergies     Latest Ref Rng & Units 05/04/2022   11:06 AM 10/17/2020   11:50 PM 05/10/2020    4:40 PM  CBC  WBC 4.0 - 10.5 K/uL 5.3  5.9  5.3   Hemoglobin 12.0 - 15.0 g/dL 40.9  81.1  91.4   Hematocrit 36.0 - 46.0 % 36.4  38.5  37.9   Platelets 150 - 400 K/uL 292  269  216       CMP     Component Value Date/Time   NA 142 05/04/2022 1106   NA 141 01/26/2013 1210   K 4.7 05/04/2022 1106   K 4.1 01/26/2013 1210   CL 106 05/04/2022 1106   CL 100 01/26/2013 1210   CO2 29 05/04/2022 1106   CO2 33 (H) 01/26/2013 1210   GLUCOSE 115 (H) 05/04/2022 1106   GLUCOSE 141 (H) 01/26/2013 1210   BUN 35 (H) 01/23/2023 1048   BUN 11 01/26/2013 1210   CREATININE 0.85 01/23/2023 1048   CREATININE 0.82 01/26/2013 1210   CALCIUM 10.5 (H) 05/04/2022 1106   CALCIUM 11.2 (H) 01/26/2013 1210   PROT 7.6 10/17/2020 2350   PROT 8.7 (H) 01/26/2013 1210   ALBUMIN 4.5 10/17/2020 2350   ALBUMIN 4.6 01/26/2013 1210   AST 19 10/17/2020 2350   AST 24 01/26/2013 1210   ALT 18 10/17/2020 2350   ALT 22 01/26/2013 1210   ALKPHOS 69 10/17/2020 2350   ALKPHOS 77 01/26/2013 1210   BILITOT 0.8 10/17/2020 2350   BILITOT 0.5 01/26/2013 1210   GFRNONAA >60 01/23/2023 1048   GFRNONAA >60 01/26/2013 1210     No results found.     Assessment & Plan:   1. 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  This represents a limb threatening ischemia and places the  patient at the risk for left limb loss.  Patient should undergo angiography of the left lower extremity with the hope for intervention for limb salvage.  The risks and benefits as well as the alternative therapies was discussed in detail with the patient.  All questions were answered.  Patient agrees to proceed with left lower extremity angiography.  The patient will follow up with me in the office after the procedure.      2. Primary hypertension Continue antihypertensive medications as already ordered, these medications have been reviewed and there are no changes at this time.   Current Outpatient Medications on File Prior to Visit  Medication Sig Dispense Refill   acetaminophen (TYLENOL) 325 MG tablet Take 1-2 tablets (325-650 mg total) by mouth every 4 (four) hours as needed for mild pain (or temp >/= 101 F).     amLODipine (NORVASC) 5 MG tablet Take 5 mg by mouth daily.     ASPIRIN 81 81 MG EC tablet Take 81 mg by mouth daily.     brimonidine (ALPHAGAN) 0.2 % ophthalmic solution Place 1 drop into both eyes 2 (two) times daily.     clopidogrel (PLAVIX) 75 MG tablet Take 75 mg by mouth daily.     dorzolamide (TRUSOPT) 2 % ophthalmic solution Place 1 drop into both eyes 2 (two) times daily.     hydrochlorothiazide (HYDRODIURIL) 25 MG tablet Take 25 mg by mouth daily.     olmesartan (BENICAR) 40 MG tablet Take 40 mg by mouth daily.     vitamin B-12 (CYANOCOBALAMIN) 1000 MCG tablet Take 1,000 mcg by mouth daily.     No current facility-administered medications on file prior to visit.    There are no Patient Instructions on file for this visit. No follow-ups on file.   Georgiana Spinner, NP

## 2023-03-28 NOTE — Progress Notes (Signed)
Subjective:    Patient ID: Erin Greer, female    DOB: January 23, 1928, 89 y.o.   MRN: [id number] Chief Complaint  Patient presents with   Follow-up    1 month follow up with ABI    The patient presents today with her daughter.  The patient herself has severe dementia and is nonverbal and does not participate with the discussion today.  The patient recently had an angiogram approximately 1 month or so ago.  She underwent extensive intervention of her left lower extremity including the lobectomy of the left SFA, popliteal artery and tibioperoneal trunk.  The patient was in her usual state of health until was noted that recently with some positioning changes the patient's foot Became very cold.  There was no development of open wounds or ulcerations.  Physical exam of the patient's left lower extremity is very cold to touch.  The patient's daughter notes that she was concerned about this and contact her office for evaluation.  Today noninvasive studies show a left SFA and popliteal stents are occluded post thrombectomy.    Review of Systems  Neurological:  Positive for weakness.  All other systems reviewed and are negative.      Objective:   Physical Exam Vitals reviewed.  HENT:     Head: Normocephalic.  Cardiovascular:     Rate and Rhythm: Normal rate.  Pulmonary:     Effort: Pulmonary effort is normal.  Skin:    General: Skin is cool and dry.  Neurological:     Mental Status: She is alert and oriented to person, place, and time.  Psychiatric:        Mood and Affect: Mood normal.        Behavior: Behavior normal.        Thought Content: Thought content normal.        Judgment: Judgment normal.     BP (!) 151/70 (BP Location: Left Arm)   Pulse 74   Resp 16   Ht 5\' 2"  (1.575 m)   Wt 76 lb (34.5 kg)   BMI 13.90 kg/m   Past Medical History:  Diagnosis Date   Dementia (HCC)    High cholesterol    Hypertension    PVD (peripheral vascular disease) with  claudication (HCC)    Stroke (HCC)     Social History   Socioeconomic History   Marital status: Married    Spouse name: Not on file   Number of children: Not on file   Years of education: Not on file   Highest education level: Not on file  Occupational History   Not on file  Tobacco Use   Smoking status: Former    Types: Cigarettes   Smokeless tobacco: Never  Vaping Use   Vaping status: Never Used  Substance and Sexual Activity   Alcohol use: No   Drug use: No   Sexual activity: Not Currently  Other Topics Concern   Not on file  Social History Narrative   Not on file   Social Determinants of Health   Financial Resource Strain: Low Risk  (01/10/2023)   Received from [facility], [facility]   Overall Financial Resource Strain (CARDIA)    Difficulty of Paying Living Expenses: Not hard at all  Food Insecurity: No Food Insecurity (01/23/2023)   Hunger Vital Sign    Worried About Running Out of Food in the Last Year: Never true    Ran Out of Food in the Last  Year: Never true  Transportation Needs: No Transportation Needs (01/23/2023)   PRAPARE - Administrator, Civil Service (Medical): No    Lack of Transportation (Non-Medical): No  Physical Activity: Not on file  Stress: Not on file  Social Connections: Not on file  Intimate Partner Violence: Not At Risk (01/23/2023)   Humiliation, Afraid, Rape, and Kick questionnaire    Fear of Current or Ex-Partner: No    Emotionally Abused: No    Physically Abused: No    Sexually Abused: No    Past Surgical History:  Procedure Laterality Date   ABDOMINAL HYSTERECTOMY     LOWER EXTREMITY ANGIOGRAPHY Left 09/26/2022   Procedure: Lower Extremity Angiography;  Surgeon: Annice Needy, MD;  Location: [facility];  Service: Cardiovascular;  Laterality: Left;   LOWER EXTREMITY ANGIOGRAPHY Left 01/23/2023   Procedure: Lower Extremity Angiography;  Surgeon: Annice Needy, MD;  Location:  [facility];  Service: Cardiovascular;  Laterality: Left;   PERIPHERAL VASCULAR CATHETERIZATION N/A 10/13/2015   Procedure: Abdominal Aortogram w/Lower Extremity;  Surgeon: Renford Dills, MD;  Location: [facility];  Service: Cardiovascular;  Laterality: N/A;   PERIPHERAL VASCULAR CATHETERIZATION  10/13/2015   Procedure: Lower Extremity Intervention;  Surgeon: Renford Dills, MD;  Location: [facility];  Service: Cardiovascular;;    Family History  Problem Relation Age of Onset   Breast cancer Neg Hx     No Known Allergies     Latest Ref Rng & Units 05/04/2022   11:06 AM 10/17/2020   11:50 PM 05/10/2020    4:40 PM  CBC  WBC 4.0 - 10.5 K/uL 5.3  5.9  5.3   Hemoglobin 12.0 - 15.0 g/dL 40.9  81.1  91.4   Hematocrit 36.0 - 46.0 % 36.4  38.5  37.9   Platelets 150 - 400 K/uL 292  269  216       CMP     Component Value Date/Time   NA 142 05/04/2022 1106   NA 141 01/26/2013 1210   K 4.7 05/04/2022 1106   K 4.1 01/26/2013 1210   CL 106 05/04/2022 1106   CL 100 01/26/2013 1210   CO2 29 05/04/2022 1106   CO2 33 (H) 01/26/2013 1210   GLUCOSE 115 (H) 05/04/2022 1106   GLUCOSE 141 (H) 01/26/2013 1210   BUN 35 (H) 01/23/2023 1048   BUN 11 01/26/2013 1210   CREATININE 0.85 01/23/2023 1048   CREATININE 0.82 01/26/2013 1210   CALCIUM 10.5 (H) 05/04/2022 1106   CALCIUM 11.2 (H) 01/26/2013 1210   PROT 7.6 10/17/2020 2350   PROT 8.7 (H) 01/26/2013 1210   ALBUMIN 4.5 10/17/2020 2350   ALBUMIN 4.6 01/26/2013 1210   AST 19 10/17/2020 2350   AST 24 01/26/2013 1210   ALT 18 10/17/2020 2350   ALT 22 01/26/2013 1210   ALKPHOS 69 10/17/2020 2350   ALKPHOS 77 01/26/2013 1210   BILITOT 0.8 10/17/2020 2350   BILITOT 0.5 01/26/2013 1210   GFRNONAA >60 01/23/2023 1048   GFRNONAA >60 01/26/2013 1210     No results found.     Assessment & Plan:   1. 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  This represents a limb threatening ischemia and places the  patient at the risk for left limb loss.  Patient should undergo angiography of the left lower extremity with the hope for intervention for limb salvage.  The risks and benefits as well as the alternative therapies was discussed in detail with the patient.  All questions were answered.  Patient agrees to proceed with left lower extremity angiography.  The patient will follow up with me in the office after the procedure.      2. Primary hypertension Continue antihypertensive medications as already ordered, these medications have been reviewed and there are no changes at this time.   Current Outpatient Medications on File Prior to Visit  Medication Sig Dispense Refill   acetaminophen (TYLENOL) 325 MG tablet Take 1-2 tablets (325-650 mg total) by mouth every 4 (four) hours as needed for mild pain (or temp >/= 101 F).     amLODipine (NORVASC) 5 MG tablet Take 5 mg by mouth daily.     ASPIRIN 81 81 MG EC tablet Take 81 mg by mouth daily.     brimonidine (ALPHAGAN) 0.2 % ophthalmic solution Place 1 drop into both eyes 2 (two) times daily.     clopidogrel (PLAVIX) 75 MG tablet Take 75 mg by mouth daily.     dorzolamide (TRUSOPT) 2 % ophthalmic solution Place 1 drop into both eyes 2 (two) times daily.     hydrochlorothiazide (HYDRODIURIL) 25 MG tablet Take 25 mg by mouth daily.     olmesartan (BENICAR) 40 MG tablet Take 40 mg by mouth daily.     vitamin B-12 (CYANOCOBALAMIN) 1000 MCG tablet Take 1,000 mcg by mouth daily.     No current facility-administered medications on file prior to visit.    There are no Patient Instructions on file for this visit. No follow-ups on file.   Georgiana Spinner, NP

## 2023-03-28 NOTE — Progress Notes (Incomplete)
Subjective:    Patient ID: Erin Greer, female    DOB: 09-Feb-1928, 89 y.o.   MRN: [id number] Chief Complaint  Patient presents with  . Follow-up    1 month follow up with ABI    HPI  Review of Systems     Objective:   Physical Exam  BP (!) 151/70 (BP Location: Left Arm)   Pulse 74   Resp 16   Ht 5\' 2"  (1.575 m)   Wt 76 lb (34.5 kg)   BMI 13.90 kg/m   Past Medical History:  Diagnosis Date  . Dementia (HCC)   . High cholesterol   . Hypertension   . PVD (peripheral vascular disease) with claudication (HCC)   . Stroke [facility])     Social History   Socioeconomic History  . Marital status: Married    Spouse name: Not on file  . Number of children: Not on file  . Years of education: Not on file  . Highest education level: Not on file  Occupational History  . Not on file  Tobacco Use  . Smoking status: Former    Types: Cigarettes  . Smokeless tobacco: Never  Vaping Use  . Vaping status: Never Used  Substance and Sexual Activity  . Alcohol use: No  . Drug use: No  . Sexual activity: Not Currently  Other Topics Concern  . Not on file  Social History Narrative  . Not on file   Social Determinants of Health   Financial Resource Strain: Low Risk  (01/10/2023)   Received from [facility], [facility]   Overall Financial Resource Strain (CARDIA)   . Difficulty of Paying Living Expenses: Not hard at all  Food Insecurity: No Food Insecurity (01/23/2023)   Hunger Vital Sign   . Worried About Programme researcher, broadcasting/film/video in the Last Year: Never true   . Ran Out of Food in the Last Year: Never true  Transportation Needs: No Transportation Needs (01/23/2023)   PRAPARE - Transportation   . Lack of Transportation (Medical): No   . Lack of Transportation (Non-Medical): No  Physical Activity: Not on file  Stress: Not on file  Social Connections: Not on file  Intimate Partner Violence: Not At Risk (01/23/2023)   Humiliation, Afraid,  Rape, and Kick questionnaire   . Fear of Current or Ex-Partner: No   . Emotionally Abused: No   . Physically Abused: No   . Sexually Abused: No    Past Surgical History:  Procedure Laterality Date  . ABDOMINAL HYSTERECTOMY    . LOWER EXTREMITY ANGIOGRAPHY Left 09/26/2022   Procedure: Lower Extremity Angiography;  Surgeon: Annice Needy, MD;  Location: [facility];  Service: Cardiovascular;  Laterality: Left;  . LOWER EXTREMITY ANGIOGRAPHY Left 01/23/2023   Procedure: Lower Extremity Angiography;  Surgeon: Annice Needy, MD;  Location: [facility];  Service: Cardiovascular;  Laterality: Left;  . PERIPHERAL VASCULAR CATHETERIZATION N/A 10/13/2015   Procedure: Abdominal Aortogram w/Lower Extremity;  Surgeon: Renford Dills, MD;  Location: [facility];  Service: Cardiovascular;  Laterality: N/A;  . PERIPHERAL VASCULAR CATHETERIZATION  10/13/2015   Procedure: Lower Extremity Intervention;  Surgeon: Renford Dills, MD;  Location: [facility];  Service: Cardiovascular;;    Family History  Problem Relation Age of Onset  . Breast cancer Neg Hx     No Known Allergies     Latest Ref Rng & Units 05/04/2022   11:06 AM  10/17/2020   11:50 PM 05/10/2020    4:40 PM  CBC  WBC 4.0 - 10.5 K/uL 5.3  5.9  5.3   Hemoglobin 12.0 - 15.0 g/dL 29.5  62.1  30.8   Hematocrit 36.0 - 46.0 % 36.4  38.5  37.9   Platelets 150 - 400 K/uL 292  269  216       CMP     Component Value Date/Time   NA 142 05/04/2022 1106   NA 141 01/26/2013 1210   K 4.7 05/04/2022 1106   K 4.1 01/26/2013 1210   CL 106 05/04/2022 1106   CL 100 01/26/2013 1210   CO2 29 05/04/2022 1106   CO2 33 (H) 01/26/2013 1210   GLUCOSE 115 (H) 05/04/2022 1106   GLUCOSE 141 (H) 01/26/2013 1210   BUN 35 (H) 01/23/2023 1048   BUN 11 01/26/2013 1210   CREATININE 0.85 01/23/2023 1048   CREATININE 0.82 01/26/2013 1210   CALCIUM 10.5 (H) 05/04/2022 1106   CALCIUM 11.2 (H) 01/26/2013 1210   PROT 7.6  10/17/2020 2350   PROT 8.7 (H) 01/26/2013 1210   ALBUMIN 4.5 10/17/2020 2350   ALBUMIN 4.6 01/26/2013 1210   AST 19 10/17/2020 2350   AST 24 01/26/2013 1210   ALT 18 10/17/2020 2350   ALT 22 01/26/2013 1210   ALKPHOS 69 10/17/2020 2350   ALKPHOS 77 01/26/2013 1210   BILITOT 0.8 10/17/2020 2350   BILITOT 0.5 01/26/2013 1210   GFRNONAA >60 01/23/2023 1048   GFRNONAA >60 01/26/2013 1210     No results found.     Assessment & Plan:   1. 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 Primary hypertension ***   Current Outpatient Medications on File Prior to Visit  Medication Sig Dispense Refill  . acetaminophen (TYLENOL) 325 MG tablet Take 1-2 tablets (325-650 mg total) by mouth every 4 (four) hours as needed for mild pain (or temp >/= 101 F).    Marland Kitchen amLODipine (NORVASC) 5 MG tablet Take 5 mg by mouth daily.    . ASPIRIN 81 81 MG EC tablet Take 81 mg by mouth daily.    . brimonidine (ALPHAGAN) 0.2 % ophthalmic solution Place 1 drop into both eyes 2 (two) times daily.    . clopidogrel (PLAVIX) 75 MG tablet Take 75 mg by mouth daily.    . dorzolamide (TRUSOPT) 2 % ophthalmic solution Place 1 drop into both eyes 2 (two) times daily.    . hydrochlorothiazide (HYDRODIURIL) 25 MG tablet Take 25 mg by mouth daily.    Marland Kitchen olmesartan (BENICAR) 40 MG tablet Take 40 mg by mouth daily.    . vitamin B-12 (CYANOCOBALAMIN) 1000 MCG tablet Take 1,000 mcg by mouth daily.     No current facility-administered medications on file prior to visit.    There are no Patient Instructions on file for this visit. No follow-ups on file.   Georgiana Spinner, NP

## 2023-03-29 ENCOUNTER — Telehealth (INDEPENDENT_AMBULATORY_CARE_PROVIDER_SITE_OTHER): Payer: Self-pay

## 2023-03-29 NOTE — Telephone Encounter (Signed)
Spoke with the patient's daughter and the patient  is scheduled with Dr. Wyn Quaker on 03/30/23 with a 9:30 am arrival time to the [facility]. Pre-procedure instructions were discussed and patient's daughter stated she understood.

## 2023-03-30 ENCOUNTER — Encounter
Admission: RE | Disposition: A | Discharge status: Home / Self Care | Payer: Self-pay | Source: Home / Self Care | Attending: Vascular Surgery

## 2023-03-30 ENCOUNTER — Encounter: Payer: Self-pay | Admitting: Vascular Surgery

## 2023-03-30 ENCOUNTER — Ambulatory Visit
Admission: RE | Admit: 2023-03-30 | Discharge: 2023-03-30 | Disposition: A | Discharge status: Home / Self Care | Payer: Medicare Other | Attending: Vascular Surgery | Admitting: Vascular Surgery

## 2023-03-30 ENCOUNTER — Other Ambulatory Visit: Payer: Self-pay

## 2023-03-30 DIAGNOSIS — T82856A Stenosis of peripheral vascular stent, initial encounter: Secondary | ICD-10-CM | POA: Diagnosis not present

## 2023-03-30 DIAGNOSIS — I10 Essential (primary) hypertension: Secondary | ICD-10-CM | POA: Insufficient documentation

## 2023-03-30 DIAGNOSIS — I70222 Atherosclerosis of native arteries of extremities with rest pain, left leg: Secondary | ICD-10-CM | POA: Diagnosis present

## 2023-03-30 DIAGNOSIS — Z9889 Other specified postprocedural states: Secondary | ICD-10-CM | POA: Diagnosis not present

## 2023-03-30 DIAGNOSIS — Z87891 Personal history of nicotine dependence: Secondary | ICD-10-CM | POA: Diagnosis not present

## 2023-03-30 DIAGNOSIS — I70229 Atherosclerosis of native arteries of extremities with rest pain, unspecified extremity: Secondary | ICD-10-CM

## 2023-03-30 DIAGNOSIS — F03C0 Unspecified dementia, severe, without behavioral disturbance, psychotic disturbance, mood disturbance, and anxiety: Secondary | ICD-10-CM | POA: Diagnosis not present

## 2023-03-30 DIAGNOSIS — I77811 Abdominal aortic ectasia: Secondary | ICD-10-CM

## 2023-03-30 DIAGNOSIS — I743 Embolism and thrombosis of arteries of the lower extremities: Secondary | ICD-10-CM | POA: Diagnosis not present

## 2023-03-30 HISTORY — PX: LOWER EXTREMITY ANGIOGRAPHY: CATH118251

## 2023-03-30 SURGERY — LOWER EXTREMITY ANGIOGRAPHY
Anesthesia: Moderate Sedation | Site: Leg Lower | Laterality: Left

## 2023-03-30 MED ORDER — FENTANYL CITRATE (PF) 100 MCG/2ML IJ SOLN
INTRAMUSCULAR | Status: DC | PRN
  Administered 2023-03-30 (×4): 25 ug via INTRAVENOUS

## 2023-03-30 MED ORDER — SODIUM CHLORIDE 0.9% FLUSH: 3.0000 mL | INTRAVENOUS | Status: DC | PRN

## 2023-03-30 MED ORDER — NITROGLYCERIN 1 MG/10 ML FOR IR/CATH LAB
INTRA_ARTERIAL | Status: AC
  Filled 2023-03-30: qty 10

## 2023-03-30 MED ORDER — CEFAZOLIN SODIUM-DEXTROSE 2-4 GM/100ML-% IV SOLN
INTRAVENOUS | Status: AC
  Filled 2023-03-30: qty 100

## 2023-03-30 MED ORDER — FENTANYL CITRATE (PF) 100 MCG/2ML IJ SOLN
INTRAMUSCULAR | Status: AC
  Filled 2023-03-30: qty 2

## 2023-03-30 MED ORDER — CEFAZOLIN SODIUM-DEXTROSE 2-4 GM/100ML-% IV SOLN
2.0000 g | INTRAVENOUS | Status: AC
  Administered 2023-03-30: 2 g via INTRAVENOUS

## 2023-03-30 MED ORDER — ONDANSETRON HCL 4 MG/2ML IJ SOLN: 4.0000 mg | Freq: Four times a day (QID) | INTRAMUSCULAR | Status: DC | PRN

## 2023-03-30 MED ORDER — HEPARIN SODIUM (PORCINE) 1000 UNIT/ML IJ SOLN
INTRAMUSCULAR | Status: AC
  Filled 2023-03-30: qty 10

## 2023-03-30 MED ORDER — SODIUM CHLORIDE 0.9 % IV SOLN: INTRAVENOUS | Status: DC

## 2023-03-30 MED ORDER — MIDAZOLAM HCL 2 MG/2ML IJ SOLN
INTRAMUSCULAR | Status: DC | PRN
  Administered 2023-03-30: .5 mg via INTRAVENOUS
  Administered 2023-03-30: 1 mg via INTRAVENOUS
  Administered 2023-03-30: .5 mg via INTRAVENOUS

## 2023-03-30 MED ORDER — HYDRALAZINE HCL 20 MG/ML IJ SOLN: 5.0000 mg | INTRAMUSCULAR | Status: DC | PRN

## 2023-03-30 MED ORDER — LABETALOL HCL 5 MG/ML IV SOLN
INTRAVENOUS | Status: AC
  Filled 2023-03-30: qty 4

## 2023-03-30 MED ORDER — LIDOCAINE-EPINEPHRINE (PF) 1 %-1:200000 IJ SOLN
INTRAMUSCULAR | Status: DC | PRN
  Administered 2023-03-30: 10 mL

## 2023-03-30 MED ORDER — SODIUM CHLORIDE 0.9% FLUSH: 3.0000 mL | Freq: Two times a day (BID) | INTRAVENOUS | Status: DC

## 2023-03-30 MED ORDER — HYDROMORPHONE HCL 1 MG/ML IJ SOLN: 1.0000 mg | Freq: Once | INTRAMUSCULAR | Status: DC | PRN

## 2023-03-30 MED ORDER — MIDAZOLAM HCL 2 MG/ML PO SYRP: 8.0000 mg | ORAL_SOLUTION | Freq: Once | ORAL | Status: DC | PRN

## 2023-03-30 MED ORDER — DIPHENHYDRAMINE HCL 50 MG/ML IJ SOLN: 50.0000 mg | Freq: Once | INTRAMUSCULAR | Status: DC | PRN

## 2023-03-30 MED ORDER — METHYLPREDNISOLONE SODIUM SUCC 125 MG IJ SOLR: 125.0000 mg | Freq: Once | INTRAMUSCULAR | Status: DC | PRN

## 2023-03-30 MED ORDER — FAMOTIDINE 20 MG PO TABS: 40.0000 mg | ORAL_TABLET | Freq: Once | ORAL | Status: DC | PRN

## 2023-03-30 MED ORDER — ACETAMINOPHEN 325 MG PO TABS: 650.0000 mg | ORAL_TABLET | ORAL | Status: DC | PRN

## 2023-03-30 MED ORDER — HEPARIN (PORCINE) IN NACL 1000-0.9 UT/500ML-% IV SOLN
INTRAVENOUS | Status: DC | PRN
  Administered 2023-03-30: 1000 mL

## 2023-03-30 MED ORDER — NITROGLYCERIN 1 MG/10 ML FOR IR/CATH LAB
INTRA_ARTERIAL | Status: DC | PRN
  Administered 2023-03-30: 400 ug via INTRA_ARTERIAL
  Administered 2023-03-30: 300 ug via INTRA_ARTERIAL

## 2023-03-30 MED ORDER — LABETALOL HCL 5 MG/ML IV SOLN
INTRAVENOUS | Status: DC | PRN
  Administered 2023-03-30: 20 mg via INTRAVENOUS

## 2023-03-30 MED ORDER — ATORVASTATIN CALCIUM 10 MG PO TABS
10.0000 mg | ORAL_TABLET | Freq: Every day | ORAL | Status: DC
  Filled 2023-03-30: qty 1

## 2023-03-30 MED ORDER — MIDAZOLAM HCL 2 MG/2ML IJ SOLN
INTRAMUSCULAR | Status: AC
  Filled 2023-03-30: qty 2

## 2023-03-30 MED ORDER — IODIXANOL 320 MG/ML IV SOLN
INTRAVENOUS | Status: DC | PRN
  Administered 2023-03-30: 75 mL via INTRA_ARTERIAL

## 2023-03-30 MED ORDER — ATORVASTATIN CALCIUM 10 MG PO TABS: 10.0000 mg | ORAL_TABLET | Freq: Every day | ORAL | 11 refills | Status: DC

## 2023-03-30 MED ORDER — SODIUM CHLORIDE 0.9 % IV SOLN: 250.0000 mL | INTRAVENOUS | Status: DC | PRN

## 2023-03-30 MED ORDER — LABETALOL HCL 5 MG/ML IV SOLN: 10.0000 mg | INTRAVENOUS | Status: DC | PRN

## 2023-03-30 MED ORDER — HEPARIN SODIUM (PORCINE) 1000 UNIT/ML IJ SOLN
INTRAMUSCULAR | Status: DC | PRN
  Administered 2023-03-30: 3000 [IU] via INTRAVENOUS

## 2023-03-30 SURGICAL SUPPLY — 29 items
BALLN DORADO 4X100X135 (BALLOONS) ×1
BALLN DORADO 6X200X135 (BALLOONS) ×1
BALLN LUTONIX 018 5X220X130 (BALLOONS) ×1
BALLN ULTRVRSE 2.5X300X150 (BALLOONS) ×1
BALLN ULTRVRSE 3X150X150 (BALLOONS) ×1
BALLOON DORADO 4X100X135 (BALLOONS) IMPLANT
BALLOON DORADO 6X200X135 (BALLOONS) IMPLANT
BALLOON LUTONIX 018 5X220X130 (BALLOONS) IMPLANT
BALLOON ULTRVRSE 2.5X300X150 (BALLOONS) IMPLANT
BALLOON ULTRVRSE 3X150X150 (BALLOONS) IMPLANT
CANISTER PENUMBRA ENGINE (MISCELLANEOUS) IMPLANT
CATH ANGIO 5F PIGTAIL 65CM (CATHETERS) IMPLANT
CATH BEACON 5 .038 100 VERT TP (CATHETERS) IMPLANT
CATH INDIGO CAT6 KIT (CATHETERS) IMPLANT
CATH LIGHTNING BOLT 7 130 (CATHETERS) IMPLANT
COVER PROBE ULTRASOUND 5X96 (MISCELLANEOUS) IMPLANT
DEVICE STARCLOSE SE CLOSURE (Vascular Products) IMPLANT
GLIDEWIRE ADV .035X260CM (WIRE) IMPLANT
KIT ENCORE 26 ADVANTAGE (KITS) IMPLANT
PACK ANGIOGRAPHY (CUSTOM PROCEDURE TRAY) ×1 IMPLANT
SHEATH BRITE TIP 5FRX11 (SHEATH) IMPLANT
SHEATH BRITE TIP 6FRX11 (SHEATH) IMPLANT
SHEATH FLEXOR ANSEL2 7FRX45 (SHEATH) IMPLANT
STENT VIABAHN 5X150X120 (Permanent Stent) IMPLANT
STENT VIABAHN 7X25X120 (Permanent Stent) IMPLANT
SYR MEDRAD MARK 7 150ML (SYRINGE) IMPLANT
TUBING CONTRAST HIGH PRESS 72 (TUBING) IMPLANT
WIRE G V18X300CM (WIRE) IMPLANT
WIRE GUIDERIGHT .035X150 (WIRE) IMPLANT

## 2023-03-30 NOTE — Op Note (Signed)
[facility]  Percutaneous Study/Intervention Procedural Note   Date of Surgery: 03/30/2023  Surgeon(s):Jalyah Weinheimer    Assistants:none  Pre-operative Diagnosis: PAD with rest pain LLE  Post-operative diagnosis:  Same  Procedure(s) Performed:             1.  Ultrasound guidance for vascular access right femoral artery             2.  Catheter placement into left femoral artery from right femoral approach             3.  Aortogram and selective left lower extremity angiogram             4.  Mechanical thrombectomy of the left SFA and popliteal arteries with the penumbra 7 bolt catheter             5.  Mechanical thrombectomy to the left tibioperoneal trunk and peroneal artery with the penumbra CAT 6 catheter  6.  Percutaneous transluminal angioplasty of the left peroneal artery and tibioperoneal trunk with 2.5 mm diameter angioplasty balloon  7.  Percutaneous transluminal angioplasty of the left SFA and popliteal arteries with 5 mm diameter angioplasty balloon  8.  Stent placement to the left peroneal artery, tibioperoneal trunk, and distal popliteal artery with 5 mm diameter by 15 cm length Viabahn stent for residual stenosis and thrombus after angioplasty and thrombectomy  9.  Stent placement to the left SFA and popliteal arteries with 7 mm diameter by 25 cm length Viabahn stent for residual stenosis and thrombus after angioplasty and thrombectomy             10.  StarClose closure device right femoral artery  EBL: 100 cc  Contrast: 75 cc  Fluoro Time: 15.8 minutes  Moderate Conscious Sedation Time: approximately 79 minutes using 2 mg of Versed and 100 mcg of Fentanyl              Indications:  Patient is a 89 y.o.female with an ischemic left lower extremity with recurrent rest pain after previous revascularization earlier this year. The patient has noninvasive study showing occlusion of her previous interventions with markedly reduced flow. The patient is  brought in for angiography for further evaluation and potential treatment.  Due to the limb threatening nature of the situation, angiogram was performed for attempted limb salvage. The patient is aware that if the procedure fails, amputation would be expected.  The patient also understands that even with successful revascularization, amputation may still be required due to the severity of the situation.  Risks and benefits are discussed and informed consent is obtained.   Procedure:  The patient was identified and appropriate procedural time out was performed.  The patient was then placed supine on the table and prepped and draped in the usual sterile fashion. Moderate conscious sedation was administered during a face to face encounter with the patient throughout the procedure with my supervision of the RN administering medicines and monitoring the patient's vital signs, pulse oximetry, telemetry and mental status throughout from the start of the procedure until the patient was taken to the recovery room. Ultrasound was used to evaluate the right common femoral artery.  It was patent .  A digital ultrasound image was acquired.  A Seldinger needle was used to access the right common femoral artery under direct ultrasound guidance and a permanent image was performed.  A 0.035 J wire was advanced without resistance and a 5Fr sheath was placed.  Pigtail catheter was placed  into the aorta and an AP aortogram was performed. This demonstrated normal renal arteries and a somewhat ectatic aorta without significant stenosis and iliac segments without significant stenosis including patency of the previously placed stents.  I then crossed the aortic bifurcation and advanced to the left femoral head. Selective left lower extremity angiogram was then performed. This demonstrated flush occlusion of the left SFA at the level of her previously placed stents at the origin of the SFA.  There was then occlusion down throughout the  stents and essentially no runoff visualized distally on initial imaging. It was felt that it was in the patient's best interest to proceed with intervention after these images to avoid a second procedure and a larger amount of contrast and fluoroscopy based off of the findings from the initial angiogram. The patient was systemically heparinized and a 7 [place] Ansell sheath was then placed over the [employer] wire. I then used a Kumpe catheter and the advantage wire to navigate into the occluded stents without difficulty and then across them and down into the peroneal artery.  In the mid peroneal artery, there was a small vessel that was continuous to the ankle with very poor flow in the foot.  I then placed a V18 wire and remove the diagnostic catheter.  I then perform mechanical thrombectomy.  The penumbra 7 bolt device was brought onto the field and ran this down in the SFA and popliteal arteries, but with its large size this was not the best option for the tibioperoneal trunk and proximal peroneal artery.  I then exchanged for the penumbra CAT 6 device and was able to take this down into the tibioperoneal trunk and peroneal artery in the proximal and mid segment.  Significant thrombus burden was removed, but there remained extremely sluggish and poor flow distally.  I then proceeded with angioplasty.  A 2.5 mm diameter by 22 cm length angioplasty balloon was used to treat the tibioperoneal trunk and peroneal artery down to the mid segment with inflation up to 8 atm for 1 minute.  A 5 mm diameter by 22 cm length Lutonix drug-coated angioplasty balloon was then inflated to 12 atm in the left SFA and popliteal arteries with 2 inflations.  Completion imaging still showed minimal flow distally.  With a catheter in the popliteal artery, there was a near occlusive stenosis in the tibioperoneal trunk and proximal peroneal artery below the previously placed stents as the only runoff distally with narrowing within  the stents.  There were 2 areas of stenosis within the previously placed stents from hyperplasia 1 in the popliteal artery and 1 in the SFA that created greater than 50% stenosis even after thrombectomy.  I elected to place stents.  A 5 mm diameter by 15 cm length Viabahn stent was deployed in the peroneal artery, tibioperoneal trunk, and distal popliteal artery with a 7 mm diameter by 25 cm length Viabahn stent taken up from the proximal to mid SFA down to bridge to the 5 mm stent in the popliteal artery.  These were postdilated with 4 mm diameter high-pressure angioplasty balloon in the distal segment and 6 mm diameter high-pressure angioplasty balloon in the proximal segment.  Completion imaging showed sluggish flow through the stents with still poor runoff distally.  I performed angioplasty again of the peroneal artery with a 2.5 mm diameter by 22 cm length angioplasty balloon inflated to 10 atm for 1 minute.  At this point, there was flow but remained very sluggish.  Intra-arterial nitroglycerin was given to help with the vasospasm.  No obvious residual stenosis remained within the stents throughout the SFA, popliteal artery, tibioperoneal trunk and proximal peroneal artery but the small diseased peroneal artery distally may still limit flow but there was not much more we could do at this point. I elected to terminate the procedure. The sheath was removed and StarClose closure device was deployed in the right femoral artery with excellent hemostatic result. The patient was taken to the recovery room in stable condition having tolerated the procedure well.  Findings:               Aortogram:    This demonstrated normal renal arteries and a somewhat ectatic aorta without significant stenosis and iliac segments without significant stenosis including patency of the previously placed stents.              Left Lower Extremity:  This demonstrated flush occlusion of the left SFA at the level of her previously  placed stents at the origin of the SFA.  There was then occlusion down throughout the stents and essentially no runoff visualized distally on initial imaging.   Disposition: Patient was taken to the recovery room in stable condition having tolerated the procedure well.  Complications: None  Festus Barren 03/30/2023 12:30 PM   This note was created with Dragon Medical transcription system. Any errors in dictation are purely unintentional.

## 2023-03-30 NOTE — Interval H&P Note (Signed)
History and Physical Interval Note:  03/30/2023 9:24 AM  Erin Greer  has presented today for surgery, with the diagnosis of LLE Angio   ASO w rest pain.  The various methods of treatment have been discussed with the patient and family. After consideration of risks, benefits and other options for treatment, the patient has consented to  Procedure(s): Lower Extremity Angiography (Left) as a surgical intervention.  The patient's history has been reviewed, patient examined, no change in status, stable for surgery.  I have reviewed the patient's chart and labs.  Questions were answered to the patient's satisfaction.     Festus Barren

## 2023-03-31 ENCOUNTER — Encounter: Payer: Self-pay | Admitting: Vascular Surgery

## 2023-04-04 ENCOUNTER — Encounter (INDEPENDENT_AMBULATORY_CARE_PROVIDER_SITE_OTHER): Payer: Medicare Other

## 2023-04-04 ENCOUNTER — Ambulatory Visit (INDEPENDENT_AMBULATORY_CARE_PROVIDER_SITE_OTHER): Payer: Medicare Other | Admitting: Nurse Practitioner

## 2023-04-25 ENCOUNTER — Other Ambulatory Visit (INDEPENDENT_AMBULATORY_CARE_PROVIDER_SITE_OTHER): Payer: Self-pay | Admitting: Vascular Surgery

## 2023-04-25 DIAGNOSIS — I739 Peripheral vascular disease, unspecified: Secondary | ICD-10-CM

## 2023-04-27 ENCOUNTER — Ambulatory Visit (INDEPENDENT_AMBULATORY_CARE_PROVIDER_SITE_OTHER): Payer: Medicare Other

## 2023-04-27 ENCOUNTER — Encounter (INDEPENDENT_AMBULATORY_CARE_PROVIDER_SITE_OTHER): Payer: Self-pay | Admitting: Nurse Practitioner

## 2023-04-27 ENCOUNTER — Ambulatory Visit (INDEPENDENT_AMBULATORY_CARE_PROVIDER_SITE_OTHER): Payer: Medicare Other | Admitting: Nurse Practitioner

## 2023-04-27 DIAGNOSIS — F03C0 Unspecified dementia, severe, without behavioral disturbance, psychotic disturbance, mood disturbance, and anxiety: Secondary | ICD-10-CM

## 2023-04-27 DIAGNOSIS — Z9889 Other specified postprocedural states: Secondary | ICD-10-CM

## 2023-04-27 DIAGNOSIS — I739 Peripheral vascular disease, unspecified: Secondary | ICD-10-CM | POA: Diagnosis not present

## 2023-04-27 DIAGNOSIS — I10 Essential (primary) hypertension: Secondary | ICD-10-CM | POA: Diagnosis not present

## 2023-04-27 DIAGNOSIS — I70222 Atherosclerosis of native arteries of extremities with rest pain, left leg: Secondary | ICD-10-CM | POA: Diagnosis not present

## 2023-05-01 LAB — VAS US ABI WITH/WO TBI
Left ABI: 1.03
Right ABI: 0.76

## 2023-05-01 NOTE — Progress Notes (Signed)
Subjective:    Patient ID: Erin Greer, female    DOB: 06/07/1928, 89 y.o.   MRN: [id number] Chief Complaint  Patient presents with   Follow-up    3 week follow up    The patient returns to the office for followup and review status post angiogram with intervention on 03/30/2023.   Procedure: Procedure(s) Performed:             1.  Ultrasound guidance for vascular access right femoral artery             2.  Catheter placement into left femoral artery from right femoral approach             3.  Aortogram and selective left lower extremity angiogram             4.  Mechanical thrombectomy of the left SFA and popliteal arteries with the penumbra 7 bolt catheter             5.  Mechanical thrombectomy to the left tibioperoneal trunk and peroneal artery with the penumbra CAT 6 catheter             6.  Percutaneous transluminal angioplasty of the left peroneal artery and tibioperoneal trunk with 2.5 mm diameter angioplasty balloon             7.  Percutaneous transluminal angioplasty of the left SFA and popliteal arteries with 5 mm diameter angioplasty balloon             8.  Stent placement to the left peroneal artery, tibioperoneal trunk, and distal popliteal artery with 5 mm diameter by 15 cm length Viabahn stent for residual stenosis and thrombus after angioplasty and thrombectomy             9.  Stent placement to the left SFA and popliteal arteries with 7 mm diameter by 25 cm length Viabahn stent for residual stenosis and thrombus after angioplasty and thrombectomy             10.  StarClose closure device right femoral artery   The patient notes improvement in the lower extremity symptoms. No interval shortening of the patient's claudication distance or rest pain symptoms. No new ulcers or wounds have occurred since the last visit.  There have been no significant changes to the patient's overall health care.  No documented history of amaurosis fugax or recent TIA symptoms.  There are no recent neurological changes noted. No documented history of DVT, PE or superficial thrombophlebitis. The patient denies recent episodes of angina or shortness of breath.   ABI's Rt=0.76 and Lt=1.03 previous studies showed total occlusion of the left SFA Duplex US of the right lower extremity shows monophasic tibial waveforms with dampened toe waveforms with biphasic waveforms of the left lower extremity and normal toe waveforms    Review of Systems     Objective:   Physical Exam  BP 119/66 (BP Location: Left Arm)   Pulse 62   Resp 18   Ht 5\' 2"  (1.575 m)   Wt 76 lb (34.5 kg)   BMI 13.90 kg/m   Past Medical History:  Diagnosis Date   Dementia (HCC)    High cholesterol    Hypertension    PVD (peripheral vascular disease) with claudication (HCC)    Stroke [facility])     Social History   Socioeconomic History   Marital status: Married    Spouse name: Not on file   Number of children: Not  on file   Years of education: Not on file   Highest education level: Not on file  Occupational History   Not on file  Tobacco Use   Smoking status: Former    Types: Cigarettes   Smokeless tobacco: Never  Vaping Use   Vaping status: Never Used  Substance and Sexual Activity   Alcohol use: No   Drug use: No   Sexual activity: Not Currently  Other Topics Concern   Not on file  Social History Narrative   Not on file   Social Determinants of Health   Financial Resource Strain: Low Risk  (01/10/2023)   Received from [facility], [facility]   Overall Financial Resource Strain (CARDIA)    Difficulty of Paying Living Expenses: Not hard at all  Food Insecurity: No Food Insecurity (01/23/2023)   Hunger Vital Sign    Worried About Running Out of Food in the Last Year: Never true    Ran Out of Food in the Last Year: Never true  Transportation Needs: No Transportation Needs (01/23/2023)   PRAPARE - Administrator, Civil Service  (Medical): No    Lack of Transportation (Non-Medical): No  Physical Activity: Not on file  Stress: Not on file  Social Connections: Not on file  Intimate Partner Violence: Not At Risk (01/23/2023)   Humiliation, Afraid, Rape, and Kick questionnaire    Fear of Current or Ex-Partner: No    Emotionally Abused: No    Physically Abused: No    Sexually Abused: No    Past Surgical History:  Procedure Laterality Date   ABDOMINAL HYSTERECTOMY     LOWER EXTREMITY ANGIOGRAPHY Left 09/26/2022   Procedure: Lower Extremity Angiography;  Surgeon: Annice Needy, MD;  Location: [facility] INVASIVE CV LAB;  Service: Cardiovascular;  Laterality: Left;   LOWER EXTREMITY ANGIOGRAPHY Left 01/23/2023   Procedure: Lower Extremity Angiography;  Surgeon: Annice Needy, MD;  Location: [facility] INVASIVE CV [facility];  Service: Cardiovascular;  Laterality: Left;   LOWER EXTREMITY ANGIOGRAPHY Left 03/30/2023   Procedure: Lower Extremity Angiography;  Surgeon: Annice Needy, MD;  Location: [facility] INVAS[facility] CV [facility];  Service: Cardiovascular;  Laterality: Left;   PERIPHERAL VASCULAR CATHETERIZATION N/A 10/13/2015   Procedure: Abdominal Aortogram w/Lower Extremity;  Surgeon: Renford Dills, MD;  Location: [facility] INVAS[facility] CV [facility];  Service: Cardiovascular;  Laterality: N/A;   PERIPHERAL VASCULAR CATHETERIZATION  10/13/2015   Procedure: Lower Extremity Intervention;  Surgeon: Renford Dills, MD;  Location: [facility] INVASIVE CV LAB;  Service: Cardiovascular;;    Family History  Problem Relation Age of Onset   Breast cancer Neg Hx     No Known Allergies     Latest Ref Rng & Units 05/04/2022   11:06 AM 10/17/2020   11:50 PM 05/10/2020    4:40 PM  CBC  WBC 4.0 - 10.5 K/uL 5.3  5.9  5.3   Hemoglobin 12.0 - 15.0 g/dL 78.2  95.6  21.3   Hematocrit 36.0 - 46.0 % 36.4  38.5  37.9   Platelets 150 - 400 K/uL 292  269  216       CMP     Component Value Date/Time   NA 142 05/04/2022 1106   NA 141 01/26/2013 1210   K 4.7 05/04/2022 1106   K  4.1 01/26/2013 1210   CL 106 05/04/2022 1106   CL 100 01/26/2013 1210   CO2 29 05/04/2022 1106   CO2 33 (H) 01/26/2013 1210  GLUCOSE 115 (H) 05/04/2022 1106   GLUCOSE 141 (H) 01/26/2013 1210   BUN 35 (H) 01/23/2023 1048   BUN 11 01/26/2013 1210   CREATININE 0.85 01/23/2023 1048   CREATININE 0.82 01/26/2013 1210   CALCIUM 10.5 (H) 05/04/2022 1106   CALCIUM 11.2 (H) 01/26/2013 1210   PROT 7.6 10/17/2020 2350   PROT 8.7 (H) 01/26/2013 1210   ALBUMIN 4.5 10/17/2020 2350   ALBUMIN 4.6 01/26/2013 1210   AST 19 10/17/2020 2350   AST 24 01/26/2013 1210   ALT 18 10/17/2020 2350   ALT 22 01/26/2013 1210   ALKPHOS 69 10/17/2020 2350   ALKPHOS 77 01/26/2013 1210   BILITOT 0.8 10/17/2020 2350   BILITOT 0.5 01/26/2013 1210   GFRNONAA >60 01/23/2023 1048   GFRNONAA >60 01/26/2013 1210     No results found.     Assessment & Plan:   1. 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  The family reports that the rest pain symptoms have improved.  While the patient is nonverbal the pain symptoms have resolved.  No further invasive studies, angiography or surgery at this time The patient should continue walking and begin a more formal exercise program.  The patient should continue antiplatelet therapy and aggressive treatment of the lipid abnormalities  Continued surveillance is indicated as atherosclerosis is likely to progress with time.    Patient should undergo noninvasive studies as ordered. The patient will follow up with me to review the studies.   2. Primary hypertension Continue antihypertensive medications as already ordered, these medications have been reviewed and there are no changes at this time.  3. Severe dementia, unspecified dementia type, unspecified whether behavioral, psychotic, or mood disturbance or anxiety (HCC) The patient also has abnormalities  noted of the left lower extremity.  There was concern by her caretaker at that she also has coldness of the right lower extremity.  We offered to have the patient undergo angiogram but the family does not wish to move forward with intervention in order to allow the patient time for healing.  They are advised that the patient begins to have worsening signs or symptoms of pain or coldness of the lower extremity but they should contact her office for sooner follow-up and evaluation.  Current Outpatient Medications on File Prior to Visit  Medication Sig Dispense Refill   amLODipine (NORVASC) 5 MG tablet Take 5 mg by mouth daily.     ASPIRIN 81 81 MG EC tablet Take 81 mg by mouth daily.     atorvastatin (LIPITOR) 10 MG tablet Take 1 tablet (10 mg total) by mouth daily. 30 tablet 11   brimonidine (ALPHAGAN) 0.2 % ophthalmic solution Place 1 drop into both eyes 2 (two) times daily.     clopidogrel (PLAVIX) 75 MG tablet Take 75 mg by mouth daily.     dorzolamide (TRUSOPT) 2 % ophthalmic solution Place 1 drop into both eyes 2 (two) times daily.     hydrochlorothiazide (HYDRODIURIL) 25 MG tablet Take 25 mg by mouth daily.     olmesartan (BENICAR) 40 MG tablet Take 40 mg by mouth daily.     vitamin B-12 (CYANOCOBALAMIN) 1000 MCG tablet Take 1,000 mcg by mouth daily.     acetaminophen (TYLENOL) 325 MG tablet Take 1-2 tablets (325-650 mg total) by mouth every 4 (four) hours as needed for mild pain (or temp >/= 101 F). (Patient not taking: Reported  on 03/30/2023)     No current facility-administered medications on file prior to visit.    There are no Patient Instructions on file for this visit. No follow-ups on file.   Georgiana Spinner, NP

## 2023-05-11 ENCOUNTER — Emergency Department
Admission: EM | Admit: 2023-05-11 | Discharge: 2023-05-11 | Disposition: A | Discharge status: Home / Self Care | Payer: Medicare Other | Attending: Emergency Medicine | Admitting: Emergency Medicine

## 2023-05-11 ENCOUNTER — Other Ambulatory Visit: Payer: Self-pay

## 2023-05-11 ENCOUNTER — Emergency Department: Payer: Medicare Other

## 2023-05-11 DIAGNOSIS — S0990XA Unspecified injury of head, initial encounter: Secondary | ICD-10-CM

## 2023-05-11 DIAGNOSIS — I10 Essential (primary) hypertension: Secondary | ICD-10-CM | POA: Insufficient documentation

## 2023-05-11 DIAGNOSIS — W01198A Fall on same level from slipping, tripping and stumbling with subsequent striking against other object, initial encounter: Secondary | ICD-10-CM | POA: Diagnosis not present

## 2023-05-11 DIAGNOSIS — S0083XA Contusion of other part of head, initial encounter: Secondary | ICD-10-CM | POA: Diagnosis not present

## 2023-05-11 DIAGNOSIS — W19XXXA Unspecified fall, initial encounter: Secondary | ICD-10-CM

## 2023-05-11 DIAGNOSIS — F0390 Unspecified dementia without behavioral disturbance: Secondary | ICD-10-CM | POA: Insufficient documentation

## 2023-05-11 DIAGNOSIS — Y92019 Unspecified place in single-family (private) house as the place of occurrence of the external cause: Secondary | ICD-10-CM | POA: Insufficient documentation

## 2023-05-11 DIAGNOSIS — S0993XA Unspecified injury of face, initial encounter: Secondary | ICD-10-CM | POA: Diagnosis present

## 2023-05-11 NOTE — ED Triage Notes (Addendum)
Pt to ed from home via [facility] for a witnessed fall at home by family .HX of dementia. Baseline per EMS.  Pt has large hematoma to the front right firehead. Pt is on plavix. Per EMS family refused to be transported to [facility]. Family wanted her sent to ER to have a head CT.  Pt is alert and in no distress in triage. Pt has no verbal complaints.

## 2023-05-11 NOTE — ED Provider Notes (Signed)
[facility] Provider Note    Event Date/Time   First MD Initiated Contact with Patient 05/11/23 1351     (approximate)   History   Fall   HPI  Erin Greer is a 89 y.o. female history of dementia, hypertension, CVA presents emergency department with her daughter.  She arrived via EMS from home.  Patient's baseline for dementia is poor.  Family with the patient states they care for her at home.  States they have been having her eat and have tried to increase calories but she has lost weight down to 76 pounds.  States today the aide turned her back for 2 seconds and her mother fell hitting her head.  Has not been able to determine if she hurts anywhere else but the patient is nonweightbearing.      Physical Exam   Triage Vital Signs: ED Triage Vitals  Encounter Vitals Group     BP 05/11/23 1227 (!) 150/62     Systolic BP Percentile --      Diastolic BP Percentile --      Pulse Rate 05/11/23 1227 75     Resp 05/11/23 1227 16     Temp 05/11/23 1227 98.3 F (36.8 C)     Temp Source 05/11/23 1227 Oral     SpO2 05/11/23 1227 98 %     Weight 05/11/23 1224 75 lb (34 kg)     Height 05/11/23 1224 5\' 2"  (1.575 m)     Head Circumference --      Peak Flow --      Pain Score --      Pain Loc --      Pain Education --      Exclude from Growth Chart --     Most recent vital signs: Vitals:   05/11/23 1227  BP: (!) 150/62  Pulse: 75  Resp: 16  Temp: 98.3 F (36.8 C)  SpO2: 98%     General: Awake, no distress.  Extremely thin CV:  Good peripheral perfusion. regular rate and  rhythm Resp:  Normal effort.  Abd:  No distention.   Other:  Baseline dementia, unable to assess neuro, PERRL, patient does not respond to her daughter when she picked her up told place her on bed   ED Results / Procedures / Treatments   Labs (all labs ordered are listed, but only abnormal results are displayed) Labs Reviewed - No data to  display   EKG     RADIOLOGY CT of the head and cervical spine    PROCEDURES:   Procedures   MEDICATIONS ORDERED IN ED: Medications - No data to display   IMPRESSION / MDM / ASSESSMENT AND PLAN / ED COURSE  I reviewed the triage vital signs and the nursing notes.                              Differential diagnosis includes, but is not limited to, subdural, SAH, C-spine fracture, failure to thrive  Patient's presentation is most consistent with acute presentation with potential threat to life or bodily function.   Family would just like for [place] to do CT of the head and cervical spine.  Did offer further workup.  I did press on the patient she does not respond or grimace when touching any other bony extremities  CT of the head and cervical spine   CT of the head independently reviewed interpreted by  me as being negative for any acute abnormality.  Radiologist does comment on chronic atrophy that has worsened.  I did explain these findings to the patient's family.  Provided juice for the patient.  Her daughter states she is at baseline.  She is to follow-up with her regular doctor as needed.  Return for worsening.  She was discharged stable condition.   FINAL CLINICAL IMPRESSION(S) / ED DIAGNOSES   Final diagnoses:  Fall, initial encounter  Contusion of face, initial encounter  Minor head injury, initial encounter     Rx / DC Orders   ED Discharge Orders     None        Note:  This document was prepared using Dragon voice recognition software and may include unintentional dictation errors.    Faythe Ghee, PA-C 05/11/23 1653    Merwyn Katos, MD 05/12/23 [phone]

## 2023-08-03 ENCOUNTER — Other Ambulatory Visit (INDEPENDENT_AMBULATORY_CARE_PROVIDER_SITE_OTHER): Payer: Self-pay | Admitting: Nurse Practitioner

## 2023-08-03 DIAGNOSIS — Z9889 Other specified postprocedural states: Secondary | ICD-10-CM

## 2023-08-08 ENCOUNTER — Ambulatory Visit (INDEPENDENT_AMBULATORY_CARE_PROVIDER_SITE_OTHER): Payer: Medicare Other

## 2023-08-08 ENCOUNTER — Encounter (INDEPENDENT_AMBULATORY_CARE_PROVIDER_SITE_OTHER): Payer: Self-pay | Admitting: Vascular Surgery

## 2023-08-08 ENCOUNTER — Ambulatory Visit (INDEPENDENT_AMBULATORY_CARE_PROVIDER_SITE_OTHER): Payer: Medicare Other | Admitting: Vascular Surgery

## 2023-08-08 VITALS — BP 137/77 | HR 60 | Resp 18 | Ht 62.5 in | Wt 75.0 lb

## 2023-08-08 DIAGNOSIS — Z9889 Other specified postprocedural states: Secondary | ICD-10-CM

## 2023-08-08 DIAGNOSIS — I70222 Atherosclerosis of native arteries of extremities with rest pain, left leg: Secondary | ICD-10-CM | POA: Diagnosis not present

## 2023-08-08 DIAGNOSIS — I10 Essential (primary) hypertension: Secondary | ICD-10-CM | POA: Diagnosis not present

## 2023-08-08 DIAGNOSIS — F03C0 Unspecified dementia, severe, without behavioral disturbance, psychotic disturbance, mood disturbance, and anxiety: Secondary | ICD-10-CM

## 2023-08-08 DIAGNOSIS — I739 Peripheral vascular disease, unspecified: Secondary | ICD-10-CM | POA: Diagnosis not present

## 2023-08-08 NOTE — Progress Notes (Signed)
 MRN : [id number]  Erin Greer is a 89 y.o. (Jan 01, 1928) female who presents with chief complaint of  Chief Complaint  Patient presents with   Follow-up    3 month follow up with ABI/LEA  .  History of Present Illness: Patient returns today in follow up of her PAD. She has profound dementia and her daughter provides history today.  She underwent extensive left lower extremity revascularization for rest pain about 4 months ago.  She continues to have some degree of contracture but is no longer complaining of pain in her foot or having issues with her feet.  No ulceration or infection.  She had no periprocedural complications. ABIs today are 0.49 on the right and 1.0 on the left.  Duplex of her left SFA showed this to be open.  She does have tibial disease.  Her study is very limited on the right due to her contracture.   Current Outpatient Medications  Medication Sig Dispense Refill   amLODipine  (NORVASC ) 5 MG tablet Take 5 mg by mouth daily.     ASPIRIN  81 81 MG EC tablet Take 81 mg by mouth daily.     brimonidine  (ALPHAGAN ) 0.2 % ophthalmic solution Place 1 drop into both eyes 2 (two) times daily.     clopidogrel  (PLAVIX ) 75 MG tablet Take 75 mg by mouth daily.     dorzolamide  (TRUSOPT ) 2 % ophthalmic solution Place 1 drop into both eyes 2 (two) times daily.     hydrochlorothiazide  (HYDRODIURIL ) 25 MG tablet Take 25 mg by mouth daily.     olmesartan (BENICAR) 40 MG tablet Take 40 mg by mouth daily.     vitamin B-12 (CYANOCOBALAMIN ) 1000 MCG tablet Take 1,000 mcg by mouth daily.     acetaminophen  (TYLENOL ) 325 MG tablet Take 1-2 tablets (325-650 mg total) by mouth every 4 (four) hours as needed for mild pain (or temp >/= 101 F). (Patient not taking: Reported on 03/30/2023)     No current facility-administered medications for this visit.    Past Medical History:  Diagnosis Date   Dementia (HCC)    High cholesterol    Hypertension    PVD (peripheral vascular disease) with  claudication (HCC)    Stroke [facility])     Past Surgical History:  Procedure Laterality Date   ABDOMINAL HYSTERECTOMY     LOWER EXTREMITY ANGIOGRAPHY Left 09/26/2022   Procedure: Lower Extremity Angiography;  Surgeon: Marea Selinda RAMAN, MD;  Location: [facility] INV[facility]IVE CV [facility];  Service: Cardiovascular;  Laterality: Left;   LOWER EXTREMITY ANGIOGRAPHY Left 01/23/2023   Procedure: Lower Extremity Angiography;  Surgeon: Marea Selinda RAMAN, MD;  Location: [facility];  Service: Cardiovascular;  Laterality: Left;   LOWER EXTREMITY ANGIOGRAPHY Left 03/30/2023   Procedure: Lower Extremity Angiography;  Surgeon: Marea Selinda RAMAN, MD;  Location: [facility];  Service: Cardiovascular;  Laterality: Left;   PERIPHERAL VASCULAR CATHETERIZATION N/A 10/13/2015   Procedure: Abdominal Aortogram w/Lower Extremity;  Surgeon: Cordella KANDICE Shawl, MD;  Location: [facility];  Service: Cardiovascular;  Laterality: N/A;   PERIPHERAL VASCULAR CATHETERIZATION  10/13/2015   Procedure: Lower Extremity Intervention;  Surgeon: Cordella KANDICE Shawl, MD;  Location: [facility] IN[facility];  Service: Cardiovascular;;     Social History   Tobacco Use   Smoking status: Former    Types: Cigarettes   Smokeless tobacco: Never  Vaping Use   Vaping status: Never Used  Substance Use Topics   Alcohol use: No  Drug use: No      Family History  Problem Relation Age of Onset   Breast cancer Neg Hx      No Known Allergies   REVIEW OF SYSTEMS (Negative unless checked) Not able to obtain from patient due to profound dementia  Physical Examination  BP 137/77   Pulse 60   Resp 18   Ht 5' 2.5 (1.588 m)   Wt 75 lb (34 kg)   BMI 13.50 kg/m  Gen:  WD/WN, NAD Head: [place]/AT, No temporalis wasting. Ear/Nose/Throat: Hearing grossly intact, nares w/o erythema or drainage Eyes: Conjunctiva clear. Sclera non-icteric Neck: Supple.  Trachea midline Pulmonary:  Good air movement, no use of accessory muscles.  Cardiac: RRR, no  JVD Vascular:  Vessel Right Left  Radial Palpable Palpable                          PT Not Palpable 1+ Palpable  DP Not Palpable 1+ Palpable   Gastrointestinal: soft, non-tender/non-distended. No guarding/reflex.  Musculoskeletal: M/S 5/5 throughout.  No deformity or atrophy. No edema. Mild contractures. Neurologic: Sensation grossly intact in extremities.  Symmetrical.  Speech is limited Psychiatric: Judgment and insight are poor Dermatologic: No rashes or ulcers noted.  No cellulitis or open wounds.      Labs No results found for this or any previous visit (from the past 2160 hours).  Radiology No results found.  Assessment/Plan  Hypertension blood pressure control important in reducing the progression of atherosclerotic disease. On appropriate oral medications.   Dementia (HCC) Fairly profound.  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  Duplex of her left SFA showed this to be open.  She does have tibial disease.  Her study is very limited on the right due to her contracture.  Her daughter says she is not seeming to be in any pain and she does not have any ulcerations.  I would not recommend trying to do any intervention on her unless she shows signs of critical limb ischemia.  Follow-up in 3 to 6 months.    Selinda Gu, MD  08/08/2023 5:01 PM    This note was created with Dragon medical transcription system.  Any errors from dictation are purely unintentional

## 2023-08-08 NOTE — Assessment & Plan Note (Signed)
 Fairly profound.  Daughter is an outstanding caregiver

## 2023-08-08 NOTE — Assessment & Plan Note (Signed)
 ABIs today are 0.49 on the right and 1.0 on the left.  Duplex of her left SFA showed this to be open.  She does have tibial disease.  Her study is very limited on the right due to her contracture.  Her daughter says she is not seeming to be in any pain and she does not have any ulcerations.  I would not recommend trying to do any intervention on her unless she shows signs of critical limb ischemia.  Follow-up in 3 to 6 months.

## 2023-08-08 NOTE — Assessment & Plan Note (Signed)
 blood pressure control important in reducing the progression of atherosclerotic disease. On appropriate oral medications.

## 2023-08-09 LAB — VAS US ABI WITH/WO TBI
Left ABI: 1
Right ABI: 0.5

## 2023-11-07 ENCOUNTER — Encounter (INDEPENDENT_AMBULATORY_CARE_PROVIDER_SITE_OTHER): Payer: Medicare Other

## 2023-11-07 ENCOUNTER — Ambulatory Visit (INDEPENDENT_AMBULATORY_CARE_PROVIDER_SITE_OTHER): Payer: Medicare Other | Admitting: Vascular Surgery

## 2023-12-19 ENCOUNTER — Encounter (INDEPENDENT_AMBULATORY_CARE_PROVIDER_SITE_OTHER): Payer: Self-pay

## 2024-06-01 DEATH — deceased
